# Patient Record
Sex: Male | Born: 1945 | Race: White | Hispanic: No | Marital: Married | Smoking: Current every day smoker
Health system: Southern US, Community
[De-identification: ages and names within clinical notes are randomized; demographics above are authoritative.]

## PROBLEM LIST (undated history)

## (undated) DIAGNOSIS — G9340 Encephalopathy, unspecified: Secondary | ICD-10-CM

## (undated) DIAGNOSIS — I714 Abdominal aortic aneurysm, without rupture, unspecified: Secondary | ICD-10-CM

## (undated) DIAGNOSIS — I10 Essential (primary) hypertension: Secondary | ICD-10-CM

## (undated) DIAGNOSIS — M542 Cervicalgia: Secondary | ICD-10-CM

## (undated) DIAGNOSIS — N189 Chronic kidney disease, unspecified: Secondary | ICD-10-CM

## (undated) DIAGNOSIS — Z7901 Long term (current) use of anticoagulants: Secondary | ICD-10-CM

## (undated) DIAGNOSIS — I480 Paroxysmal atrial fibrillation: Secondary | ICD-10-CM

## (undated) DIAGNOSIS — E538 Deficiency of other specified B group vitamins: Secondary | ICD-10-CM

## (undated) DIAGNOSIS — I214 Non-ST elevation (NSTEMI) myocardial infarction: Secondary | ICD-10-CM

## (undated) DIAGNOSIS — R739 Hyperglycemia, unspecified: Secondary | ICD-10-CM

## (undated) DIAGNOSIS — Z720 Tobacco use: Secondary | ICD-10-CM

## (undated) DIAGNOSIS — R778 Other specified abnormalities of plasma proteins: Secondary | ICD-10-CM

## (undated) DIAGNOSIS — S0990XA Unspecified injury of head, initial encounter: Secondary | ICD-10-CM

## (undated) DIAGNOSIS — I219 Acute myocardial infarction, unspecified: Secondary | ICD-10-CM

## (undated) DIAGNOSIS — M1990 Unspecified osteoarthritis, unspecified site: Secondary | ICD-10-CM

## (undated) DIAGNOSIS — A419 Sepsis, unspecified organism: Secondary | ICD-10-CM

## (undated) DIAGNOSIS — I2510 Atherosclerotic heart disease of native coronary artery without angina pectoris: Secondary | ICD-10-CM

## (undated) DIAGNOSIS — I509 Heart failure, unspecified: Secondary | ICD-10-CM

## (undated) DIAGNOSIS — J189 Pneumonia, unspecified organism: Secondary | ICD-10-CM

## (undated) DIAGNOSIS — G4700 Insomnia, unspecified: Secondary | ICD-10-CM

## (undated) DIAGNOSIS — E7800 Pure hypercholesterolemia, unspecified: Secondary | ICD-10-CM

## (undated) DIAGNOSIS — I161 Hypertensive emergency: Secondary | ICD-10-CM

## (undated) DIAGNOSIS — R7989 Other specified abnormal findings of blood chemistry: Secondary | ICD-10-CM

## (undated) DIAGNOSIS — M79604 Pain in right leg: Secondary | ICD-10-CM

## (undated) DIAGNOSIS — M549 Dorsalgia, unspecified: Secondary | ICD-10-CM

## (undated) HISTORY — DX: Long term (current) use of anticoagulants: Z79.01

## (undated) HISTORY — DX: Hypertensive emergency: I16.1

## (undated) HISTORY — PX: HERNIA REPAIR: SHX51

## (undated) HISTORY — DX: Cervicalgia: M54.2

## (undated) HISTORY — DX: Pain in right leg: M79.604

## (undated) HISTORY — DX: Abdominal aortic aneurysm, without rupture, unspecified: I71.40

## (undated) HISTORY — PX: OTHER SURGICAL HISTORY: SHX169

## (undated) HISTORY — DX: Pure hypercholesterolemia, unspecified: E78.00

## (undated) HISTORY — DX: Encephalopathy, unspecified: G93.40

## (undated) HISTORY — DX: Abdominal aortic aneurysm, without rupture: I71.4

## (undated) HISTORY — DX: Unspecified osteoarthritis, unspecified site: M19.90

## (undated) HISTORY — DX: Dorsalgia, unspecified: M54.9

## (undated) HISTORY — DX: Hyperglycemia, unspecified: R73.9

## (undated) HISTORY — DX: Chronic kidney disease, unspecified: N18.9

## (undated) HISTORY — DX: Other specified abnormalities of plasma proteins: R77.8

## (undated) HISTORY — DX: Other specified abnormal findings of blood chemistry: R79.89

## (undated) HISTORY — DX: Sepsis, unspecified organism: A41.9

## (undated) HISTORY — DX: Pneumonia, unspecified organism: J18.9

## (undated) HISTORY — DX: Non-ST elevation (NSTEMI) myocardial infarction: I21.4

## (undated) HISTORY — DX: Paroxysmal atrial fibrillation: I48.0

## (undated) HISTORY — DX: Insomnia, unspecified: G47.00

## (undated) HISTORY — DX: Deficiency of other specified B group vitamins: E53.8

---

## 2014-09-24 DEATH — deceased

## 2015-03-03 DIAGNOSIS — K4020 Bilateral inguinal hernia, without obstruction or gangrene, not specified as recurrent: Secondary | ICD-10-CM | POA: Diagnosis not present

## 2015-03-04 DIAGNOSIS — Z01818 Encounter for other preprocedural examination: Secondary | ICD-10-CM | POA: Diagnosis not present

## 2015-03-04 DIAGNOSIS — K4090 Unilateral inguinal hernia, without obstruction or gangrene, not specified as recurrent: Secondary | ICD-10-CM | POA: Diagnosis not present

## 2015-03-04 DIAGNOSIS — R9431 Abnormal electrocardiogram [ECG] [EKG]: Secondary | ICD-10-CM | POA: Diagnosis not present

## 2015-03-09 DIAGNOSIS — I69351 Hemiplegia and hemiparesis following cerebral infarction affecting right dominant side: Secondary | ICD-10-CM | POA: Diagnosis not present

## 2015-03-09 DIAGNOSIS — J449 Chronic obstructive pulmonary disease, unspecified: Secondary | ICD-10-CM | POA: Diagnosis not present

## 2015-03-09 DIAGNOSIS — F17210 Nicotine dependence, cigarettes, uncomplicated: Secondary | ICD-10-CM | POA: Diagnosis not present

## 2015-03-09 DIAGNOSIS — K4090 Unilateral inguinal hernia, without obstruction or gangrene, not specified as recurrent: Secondary | ICD-10-CM | POA: Diagnosis not present

## 2015-03-09 DIAGNOSIS — I10 Essential (primary) hypertension: Secondary | ICD-10-CM | POA: Diagnosis not present

## 2015-03-09 DIAGNOSIS — K4020 Bilateral inguinal hernia, without obstruction or gangrene, not specified as recurrent: Secondary | ICD-10-CM | POA: Diagnosis not present

## 2015-05-13 DIAGNOSIS — I10 Essential (primary) hypertension: Secondary | ICD-10-CM | POA: Diagnosis not present

## 2015-05-13 DIAGNOSIS — J449 Chronic obstructive pulmonary disease, unspecified: Secondary | ICD-10-CM | POA: Diagnosis not present

## 2015-05-13 DIAGNOSIS — I6350 Cerebral infarction due to unspecified occlusion or stenosis of unspecified cerebral artery: Secondary | ICD-10-CM | POA: Diagnosis not present

## 2015-05-13 DIAGNOSIS — J019 Acute sinusitis, unspecified: Secondary | ICD-10-CM | POA: Diagnosis not present

## 2015-05-13 DIAGNOSIS — K4090 Unilateral inguinal hernia, without obstruction or gangrene, not specified as recurrent: Secondary | ICD-10-CM | POA: Diagnosis not present

## 2015-05-17 DIAGNOSIS — F17200 Nicotine dependence, unspecified, uncomplicated: Secondary | ICD-10-CM | POA: Diagnosis not present

## 2015-05-17 DIAGNOSIS — K4090 Unilateral inguinal hernia, without obstruction or gangrene, not specified as recurrent: Secondary | ICD-10-CM | POA: Diagnosis not present

## 2015-05-20 DIAGNOSIS — I10 Essential (primary) hypertension: Secondary | ICD-10-CM | POA: Diagnosis not present

## 2015-05-20 DIAGNOSIS — J449 Chronic obstructive pulmonary disease, unspecified: Secondary | ICD-10-CM | POA: Diagnosis not present

## 2015-05-20 DIAGNOSIS — F419 Anxiety disorder, unspecified: Secondary | ICD-10-CM | POA: Diagnosis not present

## 2015-05-20 DIAGNOSIS — K4090 Unilateral inguinal hernia, without obstruction or gangrene, not specified as recurrent: Secondary | ICD-10-CM | POA: Diagnosis not present

## 2015-05-20 DIAGNOSIS — Z8673 Personal history of transient ischemic attack (TIA), and cerebral infarction without residual deficits: Secondary | ICD-10-CM | POA: Diagnosis not present

## 2015-05-20 DIAGNOSIS — Z79899 Other long term (current) drug therapy: Secondary | ICD-10-CM | POA: Diagnosis not present

## 2015-05-21 DIAGNOSIS — I10 Essential (primary) hypertension: Secondary | ICD-10-CM | POA: Diagnosis not present

## 2015-05-21 DIAGNOSIS — K4090 Unilateral inguinal hernia, without obstruction or gangrene, not specified as recurrent: Secondary | ICD-10-CM | POA: Diagnosis not present

## 2015-05-21 DIAGNOSIS — Z79899 Other long term (current) drug therapy: Secondary | ICD-10-CM | POA: Diagnosis not present

## 2015-05-21 DIAGNOSIS — F419 Anxiety disorder, unspecified: Secondary | ICD-10-CM | POA: Diagnosis not present

## 2015-05-21 DIAGNOSIS — J449 Chronic obstructive pulmonary disease, unspecified: Secondary | ICD-10-CM | POA: Diagnosis not present

## 2015-05-21 DIAGNOSIS — Z8673 Personal history of transient ischemic attack (TIA), and cerebral infarction without residual deficits: Secondary | ICD-10-CM | POA: Diagnosis not present

## 2015-06-01 DIAGNOSIS — N400 Enlarged prostate without lower urinary tract symptoms: Secondary | ICD-10-CM | POA: Diagnosis not present

## 2015-06-01 DIAGNOSIS — J449 Chronic obstructive pulmonary disease, unspecified: Secondary | ICD-10-CM | POA: Diagnosis not present

## 2015-06-01 DIAGNOSIS — J209 Acute bronchitis, unspecified: Secondary | ICD-10-CM | POA: Diagnosis not present

## 2015-06-01 DIAGNOSIS — R300 Dysuria: Secondary | ICD-10-CM | POA: Diagnosis not present

## 2015-06-01 DIAGNOSIS — I10 Essential (primary) hypertension: Secondary | ICD-10-CM | POA: Diagnosis not present

## 2015-06-15 DIAGNOSIS — I10 Essential (primary) hypertension: Secondary | ICD-10-CM | POA: Diagnosis not present

## 2015-06-15 DIAGNOSIS — N400 Enlarged prostate without lower urinary tract symptoms: Secondary | ICD-10-CM | POA: Diagnosis not present

## 2015-06-15 DIAGNOSIS — J309 Allergic rhinitis, unspecified: Secondary | ICD-10-CM | POA: Diagnosis not present

## 2015-06-15 DIAGNOSIS — J449 Chronic obstructive pulmonary disease, unspecified: Secondary | ICD-10-CM | POA: Diagnosis not present

## 2015-07-15 DIAGNOSIS — I10 Essential (primary) hypertension: Secondary | ICD-10-CM | POA: Diagnosis not present

## 2015-07-15 DIAGNOSIS — J309 Allergic rhinitis, unspecified: Secondary | ICD-10-CM | POA: Diagnosis not present

## 2015-07-15 DIAGNOSIS — J449 Chronic obstructive pulmonary disease, unspecified: Secondary | ICD-10-CM | POA: Diagnosis not present

## 2015-07-27 DIAGNOSIS — R0981 Nasal congestion: Secondary | ICD-10-CM | POA: Diagnosis not present

## 2015-07-27 DIAGNOSIS — J309 Allergic rhinitis, unspecified: Secondary | ICD-10-CM | POA: Diagnosis not present

## 2015-07-27 DIAGNOSIS — J342 Deviated nasal septum: Secondary | ICD-10-CM | POA: Diagnosis not present

## 2015-07-29 DIAGNOSIS — J309 Allergic rhinitis, unspecified: Secondary | ICD-10-CM | POA: Diagnosis not present

## 2015-07-29 DIAGNOSIS — J449 Chronic obstructive pulmonary disease, unspecified: Secondary | ICD-10-CM | POA: Diagnosis not present

## 2015-07-29 DIAGNOSIS — I10 Essential (primary) hypertension: Secondary | ICD-10-CM | POA: Diagnosis not present

## 2015-09-02 DIAGNOSIS — I69951 Hemiplegia and hemiparesis following unspecified cerebrovascular disease affecting right dominant side: Secondary | ICD-10-CM | POA: Diagnosis not present

## 2015-10-07 DIAGNOSIS — Z23 Encounter for immunization: Secondary | ICD-10-CM | POA: Diagnosis not present

## 2015-10-07 DIAGNOSIS — I69951 Hemiplegia and hemiparesis following unspecified cerebrovascular disease affecting right dominant side: Secondary | ICD-10-CM | POA: Diagnosis not present

## 2015-10-07 DIAGNOSIS — G4700 Insomnia, unspecified: Secondary | ICD-10-CM | POA: Diagnosis not present

## 2015-11-11 DIAGNOSIS — I69951 Hemiplegia and hemiparesis following unspecified cerebrovascular disease affecting right dominant side: Secondary | ICD-10-CM | POA: Diagnosis not present

## 2015-11-11 DIAGNOSIS — I10 Essential (primary) hypertension: Secondary | ICD-10-CM | POA: Diagnosis not present

## 2015-11-11 DIAGNOSIS — J019 Acute sinusitis, unspecified: Secondary | ICD-10-CM | POA: Diagnosis not present

## 2015-11-11 DIAGNOSIS — J449 Chronic obstructive pulmonary disease, unspecified: Secondary | ICD-10-CM | POA: Diagnosis not present

## 2015-11-11 DIAGNOSIS — Z6824 Body mass index (BMI) 24.0-24.9, adult: Secondary | ICD-10-CM | POA: Diagnosis not present

## 2015-12-06 DIAGNOSIS — J449 Chronic obstructive pulmonary disease, unspecified: Secondary | ICD-10-CM | POA: Diagnosis not present

## 2015-12-06 DIAGNOSIS — G4700 Insomnia, unspecified: Secondary | ICD-10-CM | POA: Diagnosis not present

## 2015-12-06 DIAGNOSIS — J309 Allergic rhinitis, unspecified: Secondary | ICD-10-CM | POA: Diagnosis not present

## 2016-05-05 ENCOUNTER — Inpatient Hospital Stay (HOSPITAL_COMMUNITY): Payer: Medicare Other

## 2016-05-05 ENCOUNTER — Encounter (HOSPITAL_COMMUNITY): Payer: Self-pay

## 2016-05-05 ENCOUNTER — Emergency Department (HOSPITAL_COMMUNITY): Payer: Medicare Other

## 2016-05-05 ENCOUNTER — Inpatient Hospital Stay (HOSPITAL_COMMUNITY)
Admission: EM | Admit: 2016-05-05 | Discharge: 2016-05-12 | DRG: 208 | Disposition: A | Discharge status: Home / Self Care | Payer: Medicare Other | Attending: Internal Medicine | Admitting: Internal Medicine

## 2016-05-05 DIAGNOSIS — E1165 Type 2 diabetes mellitus with hyperglycemia: Secondary | ICD-10-CM | POA: Diagnosis present

## 2016-05-05 DIAGNOSIS — I160 Hypertensive urgency: Secondary | ICD-10-CM | POA: Diagnosis present

## 2016-05-05 DIAGNOSIS — R0602 Shortness of breath: Secondary | ICD-10-CM | POA: Diagnosis not present

## 2016-05-05 DIAGNOSIS — Z4659 Encounter for fitting and adjustment of other gastrointestinal appliance and device: Secondary | ICD-10-CM

## 2016-05-05 DIAGNOSIS — J810 Acute pulmonary edema: Secondary | ICD-10-CM

## 2016-05-05 DIAGNOSIS — J189 Pneumonia, unspecified organism: Secondary | ICD-10-CM | POA: Diagnosis not present

## 2016-05-05 DIAGNOSIS — I110 Hypertensive heart disease with heart failure: Secondary | ICD-10-CM | POA: Diagnosis present

## 2016-05-05 DIAGNOSIS — I469 Cardiac arrest, cause unspecified: Secondary | ICD-10-CM | POA: Diagnosis not present

## 2016-05-05 DIAGNOSIS — G9340 Encephalopathy, unspecified: Secondary | ICD-10-CM | POA: Diagnosis not present

## 2016-05-05 DIAGNOSIS — E874 Mixed disorder of acid-base balance: Secondary | ICD-10-CM | POA: Diagnosis not present

## 2016-05-05 DIAGNOSIS — E785 Hyperlipidemia, unspecified: Secondary | ICD-10-CM | POA: Diagnosis present

## 2016-05-05 DIAGNOSIS — J9602 Acute respiratory failure with hypercapnia: Secondary | ICD-10-CM | POA: Diagnosis present

## 2016-05-05 DIAGNOSIS — E669 Obesity, unspecified: Secondary | ICD-10-CM | POA: Diagnosis present

## 2016-05-05 DIAGNOSIS — Z978 Presence of other specified devices: Secondary | ICD-10-CM

## 2016-05-05 DIAGNOSIS — R570 Cardiogenic shock: Secondary | ICD-10-CM | POA: Diagnosis not present

## 2016-05-05 DIAGNOSIS — N179 Acute kidney failure, unspecified: Secondary | ICD-10-CM | POA: Diagnosis not present

## 2016-05-05 DIAGNOSIS — F17210 Nicotine dependence, cigarettes, uncomplicated: Secondary | ICD-10-CM | POA: Diagnosis present

## 2016-05-05 DIAGNOSIS — I214 Non-ST elevation (NSTEMI) myocardial infarction: Secondary | ICD-10-CM

## 2016-05-05 DIAGNOSIS — J157 Pneumonia due to Mycoplasma pneumoniae: Principal | ICD-10-CM | POA: Diagnosis present

## 2016-05-05 DIAGNOSIS — E872 Acidosis, unspecified: Secondary | ICD-10-CM

## 2016-05-05 DIAGNOSIS — I5021 Acute systolic (congestive) heart failure: Secondary | ICD-10-CM | POA: Diagnosis present

## 2016-05-05 DIAGNOSIS — I2510 Atherosclerotic heart disease of native coronary artery without angina pectoris: Secondary | ICD-10-CM | POA: Diagnosis not present

## 2016-05-05 DIAGNOSIS — R7989 Other specified abnormal findings of blood chemistry: Secondary | ICD-10-CM | POA: Diagnosis not present

## 2016-05-05 DIAGNOSIS — R739 Hyperglycemia, unspecified: Secondary | ICD-10-CM | POA: Diagnosis present

## 2016-05-05 DIAGNOSIS — I509 Heart failure, unspecified: Secondary | ICD-10-CM | POA: Diagnosis not present

## 2016-05-05 DIAGNOSIS — I161 Hypertensive emergency: Secondary | ICD-10-CM

## 2016-05-05 DIAGNOSIS — Z7951 Long term (current) use of inhaled steroids: Secondary | ICD-10-CM

## 2016-05-05 DIAGNOSIS — Z6828 Body mass index (BMI) 28.0-28.9, adult: Secondary | ICD-10-CM | POA: Diagnosis not present

## 2016-05-05 DIAGNOSIS — I25110 Atherosclerotic heart disease of native coronary artery with unstable angina pectoris: Secondary | ICD-10-CM | POA: Diagnosis not present

## 2016-05-05 DIAGNOSIS — I255 Ischemic cardiomyopathy: Secondary | ICD-10-CM | POA: Diagnosis present

## 2016-05-05 DIAGNOSIS — J9691 Respiratory failure, unspecified with hypoxia: Secondary | ICD-10-CM | POA: Diagnosis not present

## 2016-05-05 DIAGNOSIS — R778 Other specified abnormalities of plasma proteins: Secondary | ICD-10-CM | POA: Diagnosis present

## 2016-05-05 DIAGNOSIS — J9690 Respiratory failure, unspecified, unspecified whether with hypoxia or hypercapnia: Secondary | ICD-10-CM

## 2016-05-05 DIAGNOSIS — I472 Ventricular tachycardia: Secondary | ICD-10-CM | POA: Diagnosis not present

## 2016-05-05 DIAGNOSIS — I420 Dilated cardiomyopathy: Secondary | ICD-10-CM | POA: Diagnosis present

## 2016-05-05 DIAGNOSIS — A419 Sepsis, unspecified organism: Secondary | ICD-10-CM | POA: Diagnosis not present

## 2016-05-05 DIAGNOSIS — Z4682 Encounter for fitting and adjustment of non-vascular catheter: Secondary | ICD-10-CM | POA: Diagnosis not present

## 2016-05-05 DIAGNOSIS — Z452 Encounter for adjustment and management of vascular access device: Secondary | ICD-10-CM | POA: Diagnosis not present

## 2016-05-05 DIAGNOSIS — D751 Secondary polycythemia: Secondary | ICD-10-CM | POA: Diagnosis present

## 2016-05-05 DIAGNOSIS — R05 Cough: Secondary | ICD-10-CM | POA: Diagnosis not present

## 2016-05-05 DIAGNOSIS — J9601 Acute respiratory failure with hypoxia: Secondary | ICD-10-CM | POA: Diagnosis not present

## 2016-05-05 DIAGNOSIS — E876 Hypokalemia: Secondary | ICD-10-CM | POA: Diagnosis not present

## 2016-05-05 DIAGNOSIS — Z7982 Long term (current) use of aspirin: Secondary | ICD-10-CM

## 2016-05-05 DIAGNOSIS — J9600 Acute respiratory failure, unspecified whether with hypoxia or hypercapnia: Secondary | ICD-10-CM | POA: Diagnosis not present

## 2016-05-05 DIAGNOSIS — Z720 Tobacco use: Secondary | ICD-10-CM | POA: Diagnosis present

## 2016-05-05 DIAGNOSIS — I249 Acute ischemic heart disease, unspecified: Secondary | ICD-10-CM | POA: Diagnosis not present

## 2016-05-05 DIAGNOSIS — R0603 Acute respiratory distress: Secondary | ICD-10-CM

## 2016-05-05 HISTORY — DX: Tobacco use: Z72.0

## 2016-05-05 HISTORY — DX: Pneumonia, unspecified organism: J18.9

## 2016-05-05 HISTORY — DX: Atherosclerotic heart disease of native coronary artery without angina pectoris: I25.10

## 2016-05-05 HISTORY — DX: Essential (primary) hypertension: I10

## 2016-05-05 HISTORY — DX: Unspecified injury of head, initial encounter: S09.90XA

## 2016-05-05 LAB — COMPREHENSIVE METABOLIC PANEL
ALT: 17 U/L (ref 17–63)
AST: 58 U/L — ABNORMAL HIGH (ref 15–41)
Albumin: 4.4 g/dL (ref 3.5–5.0)
Alkaline Phosphatase: 78 U/L (ref 38–126)
Anion gap: 20 — ABNORMAL HIGH (ref 5–15)
BUN: 16 mg/dL (ref 6–20)
CALCIUM: 9.5 mg/dL (ref 8.9–10.3)
CHLORIDE: 103 mmol/L (ref 101–111)
CO2: 18 mmol/L — ABNORMAL LOW (ref 22–32)
CREATININE: 1.27 mg/dL — AB (ref 0.61–1.24)
GFR calc non Af Amer: 56 mL/min — ABNORMAL LOW (ref >60)
Glucose, Bld: 215 mg/dL — ABNORMAL HIGH (ref 65–99)
Potassium: 3.5 mmol/L (ref 3.5–5.1)
Sodium: 141 mmol/L (ref 135–145)
Total Bilirubin: 1.1 mg/dL (ref 0.3–1.2)
Total Protein: 8 g/dL (ref 6.5–8.1)

## 2016-05-05 LAB — CBC WITH DIFFERENTIAL/PLATELET
BASOS ABS: 0 10*3/uL (ref 0.0–0.1)
Basophils Relative: 0 %
Eosinophils Absolute: 0.4 10*3/uL (ref 0.0–0.7)
Eosinophils Relative: 2 %
HEMATOCRIT: 52.7 % — AB (ref 39.0–52.0)
HEMOGLOBIN: 17.5 g/dL — AB (ref 13.0–17.0)
LYMPHS PCT: 54 %
Lymphs Abs: 11.1 10*3/uL — ABNORMAL HIGH (ref 0.7–4.0)
MCH: 33.5 pg (ref 26.0–34.0)
MCHC: 33.2 g/dL (ref 30.0–36.0)
MCV: 100.8 fL — ABNORMAL HIGH (ref 78.0–100.0)
Monocytes Absolute: 1.9 10*3/uL — ABNORMAL HIGH (ref 0.1–1.0)
Monocytes Relative: 9 %
NEUTROS PCT: 35 %
Neutro Abs: 7.2 10*3/uL (ref 1.7–7.7)
Platelets: 187 10*3/uL (ref 150–400)
RBC: 5.23 MIL/uL (ref 4.22–5.81)
RDW: 13.7 % (ref 11.5–15.5)
WBC: 20.6 10*3/uL — AB (ref 4.0–10.5)

## 2016-05-05 LAB — RAPID URINE DRUG SCREEN, HOSP PERFORMED
Amphetamines: NOT DETECTED
BARBITURATES: NOT DETECTED
Benzodiazepines: NOT DETECTED
COCAINE: NOT DETECTED
Opiates: NOT DETECTED
TETRAHYDROCANNABINOL: NOT DETECTED

## 2016-05-05 LAB — BLOOD GAS, ARTERIAL
ACID-BASE DEFICIT: 7.1 mmol/L — AB (ref 0.0–2.0)
Acid-base deficit: 8.2 mmol/L — ABNORMAL HIGH (ref 0.0–2.0)
BICARBONATE: 21.5 meq/L (ref 20.0–24.0)
BICARBONATE: 20.2 meq/L (ref 20.0–24.0)
Drawn by: 257701
Drawn by: 257701
FIO2: 1
FIO2: 1
LHR: 26 {breaths}/min
MECHVT: 550 mL
O2 SAT: 98.8 %
O2 SAT: 99.2 %
PATIENT TEMPERATURE: 98.6
PATIENT TEMPERATURE: 98.6
PCO2 ART: 62.2 mmHg — AB (ref 35.0–45.0)
PCO2 ART: 49 mmHg — AB (ref 35.0–45.0)
PEEP/CPAP: 5 cmH2O
PEEP: 5 cmH2O
PH ART: 7.239 — AB (ref 7.350–7.450)
PO2 ART: 270 mmHg — AB (ref 80.0–100.0)
PO2 ART: 309 mmHg — AB (ref 80.0–100.0)
RATE: 20 resp/min
TCO2: 19.8 mmol/L (ref 0–100)
TCO2: 18.3 mmol/L (ref 0–100)
VT: 550 mL
pH, Arterial: 7.165 — CL (ref 7.350–7.450)

## 2016-05-05 LAB — URINALYSIS, ROUTINE W REFLEX MICROSCOPIC
BILIRUBIN URINE: NEGATIVE
Glucose, UA: 250 mg/dL — AB
KETONES UR: NEGATIVE mg/dL
Leukocytes, UA: NEGATIVE
NITRITE: NEGATIVE
Protein, ur: >300 mg/dL — AB
Specific Gravity, Urine: 1.028 (ref 1.005–1.030)
pH: 5.5 (ref 5.0–8.0)

## 2016-05-05 LAB — URINE MICROSCOPIC-ADD ON

## 2016-05-05 LAB — MAGNESIUM: Magnesium: 2.1 mg/dL (ref 1.7–2.4)

## 2016-05-05 LAB — PROTIME-INR
INR: 1.06 (ref 0.00–1.49)
PROTHROMBIN TIME: 14 s (ref 11.6–15.2)

## 2016-05-05 LAB — BRAIN NATRIURETIC PEPTIDE: B Natriuretic Peptide: 1255.8 pg/mL — ABNORMAL HIGH (ref 0.0–100.0)

## 2016-05-05 LAB — I-STAT TROPONIN, ED: TROPONIN I, POC: 5.77 ng/mL — AB (ref 0.00–0.08)

## 2016-05-05 LAB — LACTIC ACID, PLASMA: Lactic Acid, Venous: 1.6 mmol/L (ref 0.5–2.0)

## 2016-05-05 LAB — PROCALCITONIN: Procalcitonin: <0.1 ng/mL

## 2016-05-05 LAB — HEPARIN LEVEL (UNFRACTIONATED): HEPARIN UNFRACTIONATED: 0.16 [IU]/mL — AB (ref 0.30–0.70)

## 2016-05-05 LAB — I-STAT CG4 LACTIC ACID, ED: Lactic Acid, Venous: 10.3 mmol/L (ref 0.5–2.0)

## 2016-05-05 LAB — MRSA PCR SCREENING: MRSA by PCR: NEGATIVE

## 2016-05-05 LAB — APTT: aPTT: 28 seconds (ref 24–37)

## 2016-05-05 LAB — PHOSPHORUS: Phosphorus: 6.2 mg/dL — ABNORMAL HIGH (ref 2.5–4.6)

## 2016-05-05 LAB — TROPONIN I: Troponin I: 6.84 ng/mL (ref <0.031)

## 2016-05-05 MED ORDER — ROCURONIUM BROMIDE 50 MG/5ML IV SOLN
90.0000 mg | Freq: Once | INTRAVENOUS | Status: AC
  Administered 2016-05-05: 90 mg via INTRAVENOUS

## 2016-05-05 MED ORDER — NITROGLYCERIN IN D5W 200-5 MCG/ML-% IV SOLN
INTRAVENOUS | Status: AC
  Filled 2016-05-05: qty 250

## 2016-05-05 MED ORDER — VITAL HIGH PROTEIN PO LIQD
1000.0000 mL | ORAL | Status: DC
  Administered 2016-05-05 – 2016-05-06 (×4): 1000 mL
  Filled 2016-05-05 (×3): qty 1000

## 2016-05-05 MED ORDER — NOREPINEPHRINE BITARTRATE 1 MG/ML IV SOLN: 0.0000 ug/min | Freq: Once | INTRAVENOUS | Status: DC

## 2016-05-05 MED ORDER — DEXTROSE 5 % IV SOLN: 1.0000 g | INTRAVENOUS | Status: DC

## 2016-05-05 MED ORDER — PANTOPRAZOLE SODIUM 40 MG PO PACK
40.0000 mg | PACK | Freq: Every day | ORAL | Status: DC
  Administered 2016-05-05 – 2016-05-07 (×3): 40 mg
  Filled 2016-05-05 (×5): qty 20

## 2016-05-05 MED ORDER — ACETAMINOPHEN 160 MG/5ML PO SOLN: 650.0000 mg | Freq: Four times a day (QID) | ORAL | Status: DC | PRN

## 2016-05-05 MED ORDER — DEXTROSE 5 % IV SOLN
500.0000 mg | INTRAVENOUS | Status: DC
  Administered 2016-05-06: 500 mg via INTRAVENOUS
  Filled 2016-05-05: qty 500

## 2016-05-05 MED ORDER — DEXTROSE 5 % IV SOLN
1.0000 g | INTRAVENOUS | Status: DC
  Administered 2016-05-06: 1 g via INTRAVENOUS
  Filled 2016-05-05: qty 10

## 2016-05-05 MED ORDER — ASPIRIN 300 MG RE SUPP
300.0000 mg | Freq: Once | RECTAL | Status: AC
  Administered 2016-05-05: 300 mg via RECTAL
  Filled 2016-05-05: qty 1

## 2016-05-05 MED ORDER — FUROSEMIDE 10 MG/ML IJ SOLN
40.0000 mg | Freq: Once | INTRAMUSCULAR | Status: AC
  Administered 2016-05-05: 40 mg via INTRAVENOUS
  Filled 2016-05-05: qty 4

## 2016-05-05 MED ORDER — INSULIN ASPART 100 UNIT/ML ~~LOC~~ SOLN
0.0000 [IU] | SUBCUTANEOUS | Status: DC
  Administered 2016-05-05: 7 [IU] via SUBCUTANEOUS
  Administered 2016-05-06 (×3): 4 [IU] via SUBCUTANEOUS
  Administered 2016-05-07: 3 [IU] via SUBCUTANEOUS
  Administered 2016-05-07: 4 [IU] via SUBCUTANEOUS
  Administered 2016-05-07 (×2): 3 [IU] via SUBCUTANEOUS
  Administered 2016-05-07: 4 [IU] via SUBCUTANEOUS
  Administered 2016-05-08 (×2): 7 [IU] via SUBCUTANEOUS
  Administered 2016-05-08 (×2): 4 [IU] via SUBCUTANEOUS
  Administered 2016-05-09 (×2): 3 [IU] via SUBCUTANEOUS
  Administered 2016-05-09 (×2): 4 [IU] via SUBCUTANEOUS
  Administered 2016-05-09: 7 [IU] via SUBCUTANEOUS
  Filled 2016-05-05: qty 1

## 2016-05-05 MED ORDER — NITROGLYCERIN IN D5W 200-5 MCG/ML-% IV SOLN
10.0000 ug/min | Freq: Once | INTRAVENOUS | Status: AC
  Administered 2016-05-05: 10 ug/min via INTRAVENOUS

## 2016-05-05 MED ORDER — ANTISEPTIC ORAL RINSE SOLUTION (CORINZ)
7.0000 mL | OROMUCOSAL | Status: DC
  Administered 2016-05-05 – 2016-05-09 (×35): 7 mL via OROMUCOSAL

## 2016-05-05 MED ORDER — IPRATROPIUM-ALBUTEROL 0.5-2.5 (3) MG/3ML IN SOLN: 3.0000 mL | RESPIRATORY_TRACT | Status: DC | PRN

## 2016-05-05 MED ORDER — AZITHROMYCIN 500 MG IV SOLR
500.0000 mg | Freq: Once | INTRAVENOUS | Status: AC
  Administered 2016-05-05: 500 mg via INTRAVENOUS
  Filled 2016-05-05: qty 500

## 2016-05-05 MED ORDER — HEPARIN (PORCINE) IN NACL 100-0.45 UNIT/ML-% IJ SOLN: 12.0000 [IU]/kg/h | INTRAMUSCULAR | Status: DC

## 2016-05-05 MED ORDER — MIDAZOLAM HCL 2 MG/2ML IJ SOLN: 1.0000 mg | INTRAMUSCULAR | Status: DC | PRN

## 2016-05-05 MED ORDER — DEXTROSE 5 % IV SOLN
1.0000 g | Freq: Once | INTRAVENOUS | Status: AC
  Administered 2016-05-05: 1 g via INTRAVENOUS
  Filled 2016-05-05: qty 10

## 2016-05-05 MED ORDER — MIDAZOLAM HCL 2 MG/2ML IJ SOLN
2.0000 mg | INTRAMUSCULAR | Status: DC | PRN
  Administered 2016-05-05: 2 mg via INTRAVENOUS
  Filled 2016-05-05: qty 2

## 2016-05-05 MED ORDER — FENTANYL CITRATE (PF) 100 MCG/2ML IJ SOLN
100.0000 ug | INTRAMUSCULAR | Status: DC | PRN
  Administered 2016-05-05: 100 ug via INTRAVENOUS
  Filled 2016-05-05: qty 2

## 2016-05-05 MED ORDER — ETOMIDATE 2 MG/ML IV SOLN
30.0000 mg | Freq: Once | INTRAVENOUS | Status: AC
  Administered 2016-05-05: 30 mg via INTRAVENOUS

## 2016-05-05 MED ORDER — SODIUM CHLORIDE 0.9 % IV SOLN
25.0000 ug/h | INTRAVENOUS | Status: DC
  Administered 2016-05-05: 50 ug/h via INTRAVENOUS
  Administered 2016-05-05: 75 ug/h via INTRAVENOUS
  Filled 2016-05-05: qty 50

## 2016-05-05 MED ORDER — FENTANYL BOLUS VIA INFUSION
25.0000 ug | INTRAVENOUS | Status: DC | PRN
  Filled 2016-05-05: qty 25

## 2016-05-05 MED ORDER — HEPARIN BOLUS VIA INFUSION
4000.0000 [IU] | Freq: Once | INTRAVENOUS | Status: AC
Start: 2016-05-05 — End: 2016-05-05
  Administered 2016-05-05: 4000 [IU] via INTRAVENOUS
  Filled 2016-05-05: qty 4000

## 2016-05-05 MED ORDER — DEXTROSE 5 % IV SOLN: 500.0000 mg | INTRAVENOUS | Status: DC

## 2016-05-05 MED ORDER — HEPARIN (PORCINE) IN NACL 100-0.45 UNIT/ML-% IJ SOLN
1250.0000 [IU]/h | INTRAMUSCULAR | Status: DC
  Administered 2016-05-05 (×2): 1100 [IU]/h via INTRAVENOUS
  Administered 2016-05-06: 1250 [IU]/h via INTRAVENOUS
  Filled 2016-05-05 (×4): qty 250

## 2016-05-05 MED ORDER — ASPIRIN 81 MG PO CHEW
81.0000 mg | CHEWABLE_TABLET | Freq: Every day | ORAL | Status: DC
  Administered 2016-05-06 – 2016-05-12 (×7): 81 mg via ORAL
  Filled 2016-05-05 (×7): qty 1

## 2016-05-05 MED ORDER — PROPOFOL 1000 MG/100ML IV EMUL
INTRAVENOUS | Status: AC
  Administered 2016-05-05: 1000 mg
  Filled 2016-05-05: qty 100

## 2016-05-05 MED ORDER — FENTANYL CITRATE (PF) 100 MCG/2ML IJ SOLN
50.0000 ug | Freq: Once | INTRAMUSCULAR | Status: AC
  Administered 2016-05-05: 50 ug via INTRAVENOUS
  Filled 2016-05-05: qty 2

## 2016-05-05 MED ORDER — CHLORHEXIDINE GLUCONATE 0.12% ORAL RINSE (MEDLINE KIT)
15.0000 mL | Freq: Two times a day (BID) | OROMUCOSAL | Status: DC
  Administered 2016-05-05 – 2016-05-09 (×9): 15 mL via OROMUCOSAL

## 2016-05-05 NOTE — Progress Notes (Signed)
Patient listed as not having insurance or a pcp.  EDCM went to speak to patient at bedside, however, RN in room performing procedure.  Patient is intubated.

## 2016-05-05 NOTE — H&P (Addendum)
PULMONARY / CRITICAL CARE MEDICINE   Name: Gregory Dyer MRN: [id number] DOB: July 13, 1946    ADMISSION DATE:  05/05/2016  REFERRING MD:  ER  CHIEF COMPLAINT:  Short of breath  HISTORY OF PRESENT ILLNESS:   70 yo male developed productive cough 3 days prior to admission.  Over 24 hours prior to admission he had sudden onset of dyspnea that became progressively worse.  He was also having diaphoresis.  In ER he had SpO2 in 50's.  He required intubation.  He had BP 196/113 on arrival to ER, and temperature of 95.9.  WBC and Lactic acid elevated.  CXR showed diffuse, patchy ASD concerning for pneumonia.  There was concern for NSTEMI >> Dr. Terrence Dupont was consulted by EDP, and recommend to start heparin gtt.  He was started on antibiotics in ER for community acquired pneumonia.  PAST MEDICAL HISTORY :  He  has a past medical history of Hypertension.  PAST SURGICAL HISTORY: He  has past surgical history that includes Hernia repair.  Allergies  Allergen Reactions  . No Known Allergies     No current facility-administered medications on file prior to encounter.   No current outpatient prescriptions on file prior to encounter.    FAMILY HISTORY:  Unable to obtain family history due to altered mental status.   SOCIAL HISTORY: He  reports that he has been smoking Cigarettes.  He has been smoking about 1.00 pack per day. He has never used smokeless tobacco. He reports that he drinks about 8.4 oz of alcohol per week. He reports that he does not use illicit drugs.  REVIEW OF SYSTEMS:   Unable to obtain due to altered mental status  SUBJECTIVE:   VITAL SIGNS: BP 126/83 mmHg  Pulse 83  Temp(Src) 96.1 F (35.6 C) (Core (Comment))  Resp 26  Ht 5\' 8"  (1.727 m)  Wt 90.719 kg (200 lb)  BMI 30.42 kg/m2  SpO2 96%  HEMODYNAMICS:    VENTILATOR SETTINGS: Vent Mode:  [-] PRVC FiO2 (%):  [70 %-100 %] 70 % Set Rate:  [20 bmp-26 bmp] 26 bmp Vt Set:  [550 mL] 550 mL PEEP:  [5 cmH20] 5  cmH20 Plateau Pressure:  [25 cmH20] 25 cmH20  INTAKE / OUTPUT:    PHYSICAL EXAMINATION: General:  Awake, agitated.  Neuro:  Moves all 4 extremities, No focal defects HEENT:  PERRL, No thyromegaly, JVD Cardiovascular:  RRR, No MRG Lungs:  B/L Rhonchi, no wheeze Abdomen:  Obese, soft, distended, + BS Musculoskeletal:  Normal tone and bulk, no edema. Skin:  Intact  LABS:  BMET  Recent Labs Lab 05/05/16 1545  NA 141  K 3.5  CL 103  CO2 18*  BUN 16  CREATININE 1.27*  GLUCOSE 215*    Electrolytes  Recent Labs Lab 05/05/16 1545  CALCIUM 9.5  MG 2.1  PHOS 6.2*    CBC  Recent Labs Lab 05/05/16 1545  WBC 20.6*  HGB 17.5*  HCT 52.7*  PLT 187    Coag's  Recent Labs Lab 05/05/16 1545  APTT 28  INR 1.06    Sepsis Markers  Recent Labs Lab 05/05/16 1612  LATICACIDVEN 10.30*    ABG  Recent Labs Lab 05/05/16 1645  PHART 7.165*  PCO2ART 62.2*  PO2ART 270*    Liver Enzymes  Recent Labs Lab 05/05/16 1545  AST 58*  ALT 17  ALKPHOS 78  BILITOT 1.1  ALBUMIN 4.4    Cardiac Enzymes No results for input(s): TROPONINI, PROBNP in the last 168 hours.  Glucose No results for input(s): GLUCAP in the last 168 hours.  Imaging Dg Chest Portable 1 View  05/05/2016  CLINICAL DATA:  70 year old male with complaints of shortness of breath, and cough for the past 3 days. EXAM: PORTABLE CHEST 1 VIEW COMPARISON:  No priors. FINDINGS: An endotracheal tube is in place with tip 5.7 cm above the carina. Lung volumes appear normal. Diffuse peribronchial cuffing, interstitial prominence in widespread airspace disease throughout the lungs bilaterally. Pulmonary vasculature is obscured. Relative sparing of the lung parenchyma in the periphery of the lungs bilaterally. No definite pleural effusions. Heart size appears upper limits of normal. Upper mediastinal contours are within normal limits. Atherosclerosis in the thoracic aorta. Coronary artery stent incidentally  noted. IMPRESSION: 1. Endotracheal tube appears properly located. 2. Diffuse peribronchial cuffing, interstitial prominence and patchy airspace disease throughout the lungs bilaterally, concerning for severe bronchitis and multilobar bronchopneumonia. Findings are not favored to reflect underlying pulmonary edema, although noncardiogenic edema could be considered. 3. Atherosclerosis. Electronically Signed   By: Vinnie Langton M.D.   On: 05/05/2016 16:17    STUDIES:  CXR 5/12 > B/L opacities. Images reviewed  CULTURES: 5/12 Blood >> 5/12 Sputum >> 5/12 Pneumococcal Ag >> 5/12 Legionella Ag >> 5/12 Respiratory viral panel >>   ANTIBIOTICS: 5/12 Rocephin >> 5/12 Zithromax >>  SIGNIFICANT EVENTS: 5/12 Admit, cardiology consulted, start heparin gtt  LINES/TUBES: 5/12 ETT >>   DISCUSSION: 70 yo male smoker with acute hypoxic/hypercapnic respiratory failure, productive cough, leukocytosis, lactic acidosis, HTN emergency, b/l pulmonary infiltrates concerning for pneumonia, and NSTEMI.  ASSESSMENT / PLAN:  PULMONARY A: Acute hypoxic/hypercapnic respiratory failure 2nd to b/l pulmonary infiltrates concerning for CAP +/- acute pulmonary edema. Tobacco abuse. P:   Full vent support F/u CXR, ABG Prn duoneb  CARDIOVASCULAR A:  HTN emergency. NSTEMI likely from demand ischemia. P:  Heparin gtt per cardiology F/u cardiac enzymes, Echo ASA Cardiology consulted by EDP  RENAL A:   Elevated creatinine >> not sure what baseline renal fx is. Anion gap acidosis with elevated lactic acid. P:   F/u BMET, lactic acid, ABG Monitor renal fx, urine outpt, electrolytes  GASTROINTESTINAL A:   Nutrition. P:   Tube feeds while on vent Protonix for SUP  HEMATOLOGIC A:   Leukocytosis, polycythemia. P:  F/u CBC  INFECTIOUS A:   Sepsis from community acquired pneumonia. P:   Day 1 of rocephin, zithromax F/U mico Check HIV  ENDOCRINE A:   Hyperglycemia >> no reported hx of  DM. P:   SSI  NEUROLOGIC A:   Acute encephalopathy 2nd to respiratory failure, sepsis/pneumonia. Hypotension with propofol P:   RASS goal: -1 Fentanyl gtt + versed PRN for sedation. F/u urine drug screen  FAMILY  - Updates: Fiance updated at bedside 5/12. Pt is estranged from his family. - Inter-disciplinary family meet or Palliative Care meeting due by:  5/19  Critical care time- 35 mins.  Marshell Garfinkel MD [facility] Pager [phone] If no answer or after 3pm call: [phone] 05/05/2016, 6:13 PM
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
## 2016-05-05 NOTE — Progress Notes (Signed)
Chaplain responded to referral by staff to provide care to the Significant Other of Mr Letter, Ms Julious Payer. Ms Ronnald Ramp related that Mr Delhoyo has a heart condition and the situation is grave. She was waiting for her pastor to arrive to be with her. She was told to have the chaplain paged if she needed help in any way.  Sallee Lange. Safal Halderman, [place]

## 2016-05-05 NOTE — Progress Notes (Signed)
CRITICAL VALUE ALERT  Critical value received:  Troponin = 6.84  Date of notification:  05/05/16    Time of notification:  0720 (from ED charge nurse Armanda Heritage)   Critical value read back:Yes.    Nurse who received alert:   Merlene Laughter  MD notified (1st page):  Dr. Halford Chessman  Time of first page:  [phone] (phone notification)  Responding MD:  Dr. Halford Chessman  Time MD responded:  [phone]

## 2016-05-05 NOTE — ED Notes (Signed)
Erin MD notified about BP

## 2016-05-05 NOTE — Progress Notes (Signed)
Pt transported to ICU on VENT with 100% FIO2 without incident.  RT to monitor and assess as needed.

## 2016-05-05 NOTE — Progress Notes (Signed)
ANTICOAGULATION CONSULT NOTE - Initial Consult  Pharmacy Consult for Heparin Indication: chest pain/ACS  Allergies  Allergen Reactions  . No Known Allergies     Patient Measurements: Height: 5\' 8"  (172.7 cm) Weight: 200 lb (90.719 kg) IBW/kg (Calculated) : 68.4 Heparin Dosing Weight: 87 kg  Vital Signs: BP: 150/90 mmHg (05/12 1615) Pulse Rate: 106 (05/12 1615)  Labs:  Recent Labs  05/05/16 1545  HGB 17.5*  HCT 52.7*  PLT 187    CrCl cannot be calculated (Patient has no serum creatinine result on file.).   Medical History: Past Medical History  Diagnosis Date  . Hypertension     Assessment: 3 yoM presents with shortness of breath with productive cough over 3 days.  Denied chest pain.  Pt hypoxic with respiratory failure and intubated.  Also in hypertensive emergency.  Pharmacy consulted to start heparin infusion for possible ACS.  No history of anticoagulants on file.  Baseline anticoag labs ordered STAT. CBC shows high Hgb, plts WNL SCr pending  Goal of Therapy:  Heparin level 0.3-0.7 units/ml Monitor platelets by anticoagulation protocol: Yes   Plan:  Heparin 4000 unit bolus x 1 then start infusion at 1100 units/hr. F/u baseline labs.  Check heparin level in 6 hours. Daily CBC and heparin level while on heparin infusion.  Hershal Coria 05/05/2016,4:39 PM

## 2016-05-05 NOTE — ED Notes (Signed)
Patient brought back to res A sats in 96s, intubated. Tube placed with glide, verified. Present: [place] Bing, MD, Jimmy Picket, Billey Chang RN, Jo Cerone RN, Soundra Pilon, William NT.

## 2016-05-05 NOTE — ED Notes (Signed)
Patient brought in pov with complaints of SOB, cough x3 days (yellow sputum). 17min before arrival sudden onset of SOB, lethargy. O2Sats 50s upon arrival.

## 2016-05-05 NOTE — Consult Note (Signed)
Reason for Consult: Elevated troponin I Referring Physician: CCM  Gregory Dyer is an 70 y.o. male.  HPI: Patient is 70 year old male with past medical history significant for hypertension, tobacco abuse ran out of blood pressure medicine approximately 5 months ago came to the ER by his fiance as he developed progressive worsening shortness of breath while shopping for pickup truck. As per fiance patient has been having progressive coughing associated with shortness of breath for last 1-1/2 week last night his breathing or worst and decided to come to the ED but felt better and went back home. Patient in the ED was noted to be hypoxic tachycardic and suddenly became unresponsive requiring intubation in the ED. Patient denied any chest pain as per his fiance. She states he was doing well approximately 10 days ago ran out of blood pressure medicine approximately 5 months ago and could not afford to get it. No history of PND orthopnea or leg swelling in the past. EKG done in the ER showed sinus tachycardia with poor R-wave progression in anterior leads and T wave inversion in lateral leads. First set of troponin I was elevated to 5.77. Patient presently intubated sedated. No further history could be opted.  Past Medical History  Diagnosis Date  . Hypertension     Past Surgical History  Procedure Laterality Date  . Hernia repair      History reviewed. No pertinent family history.  Social History:  reports that he has been smoking Cigarettes.  He has been smoking about 1.00 pack per day. He has never used smokeless tobacco. He reports that he drinks about 8.4 oz of alcohol per week. He reports that he does not use illicit drugs.  Allergies:  Allergies  Allergen Reactions  . No Known Allergies     Medications: I have reviewed the patient's current medications.  Results for orders placed or performed during the hospital encounter of 05/05/16 (from the past 48 hour(s))  Comprehensive  metabolic panel     Status: Abnormal   Collection Time: 05/05/16  3:45 PM  Result Value Ref Range   Sodium 141 135 - 145 mmol/L   Potassium 3.5 3.5 - 5.1 mmol/L   Chloride 103 101 - 111 mmol/L   CO2 18 (L) 22 - 32 mmol/L   Glucose, Bld 215 (H) 65 - 99 mg/dL   BUN 16 6 - 20 mg/dL   Creatinine, Ser 1.27 (H) 0.61 - 1.24 mg/dL   Calcium 9.5 8.9 - 10.3 mg/dL   Total Protein 8.0 6.5 - 8.1 g/dL   Albumin 4.4 3.5 - 5.0 g/dL   AST 58 (H) 15 - 41 U/L   ALT 17 17 - 63 U/L   Alkaline Phosphatase 78 38 - 126 U/L   Total Bilirubin 1.1 0.3 - 1.2 mg/dL   GFR calc non Af Amer 56 (L) >60 mL/min   GFR calc Af Amer >60 >60 mL/min    Comment: (NOTE) The eGFR has been calculated using the CKD EPI equation. This calculation has not been validated in all clinical situations. eGFR's persistently <60 mL/min signify possible Chronic Kidney Disease.    Anion gap 20 (H) 5 - 15  Brain natriuretic peptide     Status: Abnormal   Collection Time: 05/05/16  3:45 PM  Result Value Ref Range   B Natriuretic Peptide 1255.8 (H) 0.0 - 100.0 pg/mL  Magnesium     Status: None   Collection Time: 05/05/16  3:45 PM  Result Value Ref Range   Magnesium  2.1 1.7 - 2.4 mg/dL  Phosphorus     Status: Abnormal   Collection Time: 05/05/16  3:45 PM  Result Value Ref Range   Phosphorus 6.2 (H) 2.5 - 4.6 mg/dL  CBC with Differential     Status: Abnormal   Collection Time: 05/05/16  3:45 PM  Result Value Ref Range   WBC 20.6 (H) 4.0 - 10.5 K/uL   RBC 5.23 4.22 - 5.81 MIL/uL   Hemoglobin 17.5 (H) 13.0 - 17.0 g/dL   HCT 52.7 (H) 39.0 - 52.0 %   MCV 100.8 (H) 78.0 - 100.0 fL   MCH 33.5 26.0 - 34.0 pg   MCHC 33.2 30.0 - 36.0 g/dL   RDW 13.7 11.5 - 15.5 %   Platelets 187 150 - 400 K/uL   Neutrophils Relative % 35 %   Lymphocytes Relative 54 %   Monocytes Relative 9 %   Eosinophils Relative 2 %   Basophils Relative 0 %   Neutro Abs 7.2 1.7 - 7.7 K/uL   Lymphs Abs 11.1 (H) 0.7 - 4.0 K/uL   Monocytes Absolute 1.9 (H) 0.1 -  1.0 K/uL   Eosinophils Absolute 0.4 0.0 - 0.7 K/uL   Basophils Absolute 0.0 0.0 - 0.1 K/uL   WBC Morphology ABSOLUTE LYMPHOCYTOSIS   APTT     Status: None   Collection Time: 05/05/16  3:45 PM  Result Value Ref Range   aPTT 28 24 - 37 seconds  Protime-INR     Status: None   Collection Time: 05/05/16  3:45 PM  Result Value Ref Range   Prothrombin Time 14.0 11.6 - 15.2 seconds   INR 1.06 0.00 - 1.49  I-stat troponin, ED (not at [facility])     Status: Abnormal   Collection Time: 05/05/16  4:09 PM  Result Value Ref Range   Troponin i, poc 5.77 (HH) 0.00 - 0.08 ng/mL   Comment NOTIFIED PHYSICIAN    Comment 3            Comment: Due to the release kinetics of cTnI, a negative result within the first hours of the onset of symptoms does not rule out myocardial infarction with certainty. If myocardial infarction is still suspected, repeat the test at appropriate intervals.   I-Stat CG4 Lactic Acid, ED  (not at  [facility])     Status: Abnormal   Collection Time: 05/05/16  4:12 PM  Result Value Ref Range   Lactic Acid, Venous 10.30 (HH) 0.5 - 2.0 mmol/L   Comment NOTIFIED PHYSICIAN   Urinalysis, Routine w reflex microscopic (not at [facility])     Status: Abnormal   Collection Time: 05/05/16  4:41 PM  Result Value Ref Range   Color, Urine YELLOW YELLOW   APPearance CLOUDY (A) CLEAR   Specific Gravity, Urine 1.028 1.005 - 1.030   pH 5.5 5.0 - 8.0   Glucose, UA 250 (A) NEGATIVE mg/dL   Hgb urine dipstick MODERATE (A) NEGATIVE   Bilirubin Urine NEGATIVE NEGATIVE   Ketones, ur NEGATIVE NEGATIVE mg/dL   Protein, ur >300 (A) NEGATIVE mg/dL   Nitrite NEGATIVE NEGATIVE   Leukocytes, UA NEGATIVE NEGATIVE  Urine microscopic-add on     Status: Abnormal   Collection Time: 05/05/16  4:41 PM  Result Value Ref Range   Squamous Epithelial / LPF 0-5 (A) NONE SEEN   WBC, UA 6-30 0 - 5 WBC/hpf   RBC / HPF 6-30 0 - 5 RBC/hpf   Bacteria, UA FEW (A) NONE SEEN   Casts  HYALINE CASTS (A) NEGATIVE   Sperm, UA  PRESENT    Urine-Other MUCOUS PRESENT     Comment: AMORPHOUS URATES/PHOSPHATES  Blood gas, arterial     Status: Abnormal   Collection Time: 05/05/16  4:45 PM  Result Value Ref Range   FIO2 1.00    Delivery systems VENTILATOR    Mode PRESSURE REGULATED VOLUME CONTROL    VT 550 mL   LHR 20 resp/min   Peep/cpap 5.0 cm H20   pH, Arterial 7.165 (LL) 7.350 - 7.450    Comment: RBV DR. Billy Fischer AT 3267 BY DEE WALTERS RRT , RCP ON 05/05/16    pCO2 arterial 62.2 (HH) 35.0 - 45.0 mmHg    Comment: CRITICAL RESULT CALLED TO, READ BACK BY AND VERIFIED WITH: DR. Billy Fischer AT 1245 BY DEE WALTERS ON 05/05/16    pO2, Arterial 270 (H) 80.0 - 100.0 mmHg   Bicarbonate 21.5 20.0 - 24.0 mEq/L   TCO2 19.8 0 - 100 mmol/L   Acid-base deficit 8.2 (H) 0.0 - 2.0 mmol/L   O2 Saturation 98.8 %   Patient temperature 98.6    Collection site RIGHT RADIAL    Drawn by [id number]    Sample type ARTERIAL DRAW     Dg Chest Portable 1 View  05/05/2016  CLINICAL DATA:  70 year old male with complaints of shortness of breath, and cough for the past 3 days. EXAM: PORTABLE CHEST 1 VIEW COMPARISON:  No priors. FINDINGS: An endotracheal tube is in place with tip 5.7 cm above the carina. Lung volumes appear normal. Diffuse peribronchial cuffing, interstitial prominence in widespread airspace disease throughout the lungs bilaterally. Pulmonary vasculature is obscured. Relative sparing of the lung parenchyma in the periphery of the lungs bilaterally. No definite pleural effusions. Heart size appears upper limits of normal. Upper mediastinal contours are within normal limits. Atherosclerosis in the thoracic aorta. Coronary artery stent incidentally noted. IMPRESSION: 1. Endotracheal tube appears properly located. 2. Diffuse peribronchial cuffing, interstitial prominence and patchy airspace disease throughout the lungs bilaterally, concerning for severe bronchitis and multilobar bronchopneumonia. Findings are not favored to reflect  underlying pulmonary edema, although noncardiogenic edema could be considered. 3. Atherosclerosis. Electronically Signed   By: Vinnie Langton M.D.   On: 05/05/2016 16:17    Review of Systems  Unable to perform ROS: intubated   Blood pressure 126/83, pulse 83, temperature 96.1 F (35.6 C), temperature source Core (Comment), resp. rate 26, height 5' 8" (1.727 m), weight 90.719 kg (200 lb), SpO2 96 %. Physical Exam  Eyes: Conjunctivae are normal. Pupils are equal, round, and reactive to light. Left eye exhibits no discharge.  Neck: Normal range of motion. Neck supple. JVD present. No tracheal deviation present. No thyromegaly present.  Cardiovascular: Normal rate and regular rhythm.   Murmur (Soft systolic murmur noted) heard. Respiratory:  Bilateral rhonchi and rales noted  GI: Soft. Bowel sounds are normal. He exhibits no distension. There is no tenderness. There is no rebound.  Musculoskeletal:  No clubbing cyanosis trace edema noted  Neurological:  Intubated sedated    Assessment/Plan: Acute coronary syndrome Acute hypoxic respiratory failure Bilateral bronchopneumonia Status post hypertensive urgency Acute congestive heart failure Tobacco abuse Plan Check serial enzymes and EKG Check 2-D echo to check LV function and wall motion abnormalities Agree with IV heparin nitroglycerin, Lasix aspirin and statin We will start beta blockers once blood pressure tolerates and fully compensated Will need cardiac catheterization once stable and extubated. Discussed with his fianc and agrees.  [name] 05/05/2016, 5:30  PM      

## 2016-05-05 NOTE — ED Notes (Signed)
Notified receiving RN, Abla that the pt has critical troponin level of 6.84

## 2016-05-05 NOTE — ED Notes (Signed)
Dr Vaughan Browner at bedside for central line placement

## 2016-05-05 NOTE — ED Notes (Signed)
Blood glucose 241. Edit sheet completed because MRN number wasn't available  at this time.

## 2016-05-05 NOTE — Progress Notes (Signed)
RT assisted ED MD with intubation- uneventful (details and vent setting on Flowsheet). Intubation resulted in positive end tidal, bilateral breath sounds, Sp02 100%. ABG will be obtain post intubation.

## 2016-05-05 NOTE — Progress Notes (Signed)
Utilization Review completed.  Livia Snellen RN CM  Completed in [place]

## 2016-05-06 ENCOUNTER — Inpatient Hospital Stay (HOSPITAL_COMMUNITY): Payer: Medicare Other

## 2016-05-06 DIAGNOSIS — J9601 Acute respiratory failure with hypoxia: Secondary | ICD-10-CM

## 2016-05-06 HISTORY — DX: Acute respiratory failure with hypoxia: J96.01

## 2016-05-06 LAB — COMPREHENSIVE METABOLIC PANEL
ALK PHOS: 52 U/L (ref 38–126)
ALT: 17 U/L (ref 17–63)
ANION GAP: 9 (ref 5–15)
AST: 34 U/L (ref 15–41)
Albumin: 2.9 g/dL — ABNORMAL LOW (ref 3.5–5.0)
BILIRUBIN TOTAL: 0.8 mg/dL (ref 0.3–1.2)
BUN: 17 mg/dL (ref 6–20)
CALCIUM: 7.6 mg/dL — AB (ref 8.9–10.3)
CO2: 23 mmol/L (ref 22–32)
Chloride: 109 mmol/L (ref 101–111)
Creatinine, Ser: 0.99 mg/dL (ref 0.61–1.24)
GFR calc Af Amer: >60 mL/min (ref >60)
GLUCOSE: 117 mg/dL — AB (ref 65–99)
POTASSIUM: 3.7 mmol/L (ref 3.5–5.1)
Sodium: 141 mmol/L (ref 135–145)
TOTAL PROTEIN: 5.3 g/dL — AB (ref 6.5–8.1)

## 2016-05-06 LAB — RESPIRATORY PANEL BY PCR

## 2016-05-06 LAB — BLOOD GAS, ARTERIAL
ACID-BASE DEFICIT: 3 mmol/L — AB (ref 0.0–2.0)
BICARBONATE: 20.6 meq/L (ref 20.0–24.0)
Drawn by: 11249
FIO2: 0.4
LHR: 26 {breaths}/min
O2 SAT: 97.9 %
PATIENT TEMPERATURE: 37.6
PCO2 ART: 35.1 mmHg (ref 35.0–45.0)
PEEP/CPAP: 5 cmH2O
PH ART: 7.389 (ref 7.350–7.450)
TCO2: 18 mmol/L (ref 0–100)
VT: 550 mL
pO2, Arterial: 108 mmHg — ABNORMAL HIGH (ref 80.0–100.0)

## 2016-05-06 LAB — LIPID PANEL
CHOLESTEROL: 150 mg/dL (ref 0–200)
HDL: 26 mg/dL — ABNORMAL LOW (ref >40)
LDL Cholesterol: 104 mg/dL — ABNORMAL HIGH (ref 0–99)
TRIGLYCERIDES: 102 mg/dL (ref <150)
Total CHOL/HDL Ratio: 5.8 RATIO
VLDL: 20 mg/dL (ref 0–40)

## 2016-05-06 LAB — CBC
HCT: 39 % (ref 39.0–52.0)
HCT: 41.4 % (ref 39.0–52.0)
Hemoglobin: 13.2 g/dL (ref 13.0–17.0)
Hemoglobin: 14 g/dL (ref 13.0–17.0)
MCH: 32.6 pg (ref 26.0–34.0)
MCH: 32.5 pg (ref 26.0–34.0)
MCHC: 33.8 g/dL (ref 30.0–36.0)
MCHC: 33.8 g/dL (ref 30.0–36.0)
MCV: 96.3 fL (ref 78.0–100.0)
MCV: 96.1 fL (ref 78.0–100.0)
PLATELETS: 133 10*3/uL — AB (ref 150–400)
PLATELETS: 150 10*3/uL (ref 150–400)
RBC: 4.05 MIL/uL — AB (ref 4.22–5.81)
RBC: 4.31 MIL/uL (ref 4.22–5.81)
RDW: 13.6 % (ref 11.5–15.5)
RDW: 13.4 % (ref 11.5–15.5)
WBC: 8.5 10*3/uL (ref 4.0–10.5)
WBC: 11.1 10*3/uL — AB (ref 4.0–10.5)

## 2016-05-06 LAB — STREP PNEUMONIAE URINARY ANTIGEN: Strep Pneumo Urinary Antigen: NEGATIVE

## 2016-05-06 LAB — TROPONIN I
TROPONIN I: 5.03 ng/mL — AB (ref <0.031)
TROPONIN I: 5.93 ng/mL — AB (ref <0.031)
TROPONIN I: 6.88 ng/mL — AB (ref <0.031)
Troponin I: 7.25 ng/mL (ref <0.031)
Troponin I: 6.93 ng/mL (ref <0.031)

## 2016-05-06 LAB — GLUCOSE, CAPILLARY
GLUCOSE-CAPILLARY: 115 mg/dL — AB (ref 65–99)
GLUCOSE-CAPILLARY: 101 mg/dL — AB (ref 65–99)
GLUCOSE-CAPILLARY: 107 mg/dL — AB (ref 65–99)
Glucose-Capillary: 115 mg/dL — ABNORMAL HIGH (ref 65–99)
Glucose-Capillary: 168 mg/dL — ABNORMAL HIGH (ref 65–99)
Glucose-Capillary: 178 mg/dL — ABNORMAL HIGH (ref 65–99)
Glucose-Capillary: 151 mg/dL — ABNORMAL HIGH (ref 65–99)

## 2016-05-06 LAB — URINE CULTURE: CULTURE: NO GROWTH

## 2016-05-06 LAB — MAGNESIUM: Magnesium: 1.3 mg/dL — ABNORMAL LOW (ref 1.7–2.4)

## 2016-05-06 LAB — HEPARIN LEVEL (UNFRACTIONATED)
HEPARIN UNFRACTIONATED: 0.34 [IU]/mL (ref 0.30–0.70)
Heparin Unfractionated: 0.13 IU/mL — ABNORMAL LOW (ref 0.30–0.70)
Heparin Unfractionated: 0.37 IU/mL (ref 0.30–0.70)

## 2016-05-06 LAB — PROCALCITONIN: Procalcitonin: 0.55 ng/mL

## 2016-05-06 LAB — ECHOCARDIOGRAM COMPLETE
HEIGHTINCHES: 68 in
Weight: 3044.11 oz

## 2016-05-06 LAB — PHOSPHORUS: Phosphorus: 1.9 mg/dL — ABNORMAL LOW (ref 2.5–4.6)

## 2016-05-06 MED ORDER — FUROSEMIDE 10 MG/ML IJ SOLN
INTRAMUSCULAR | Status: AC
  Filled 2016-05-06: qty 4

## 2016-05-06 MED ORDER — FENTANYL CITRATE (PF) 2500 MCG/50ML IJ SOLN
50.0000 ug/h | INTRAMUSCULAR | Status: DC
  Administered 2016-05-06 (×2): 150 ug/h via INTRAVENOUS
  Administered 2016-05-07: 250 ug/h via INTRAVENOUS
  Filled 2016-05-06 (×2): qty 50

## 2016-05-06 MED ORDER — SODIUM PHOSPHATES 45 MMOLE/15ML IV SOLN
10.0000 mmol | Freq: Once | INTRAVENOUS | Status: AC
  Administered 2016-05-06: 10 mmol via INTRAVENOUS
  Filled 2016-05-06: qty 3.33

## 2016-05-06 MED ORDER — ATORVASTATIN CALCIUM 80 MG PO TABS
80.0000 mg | ORAL_TABLET | Freq: Every day | ORAL | Status: DC
  Administered 2016-05-07 – 2016-05-11 (×5): 80 mg via ORAL
  Filled 2016-05-06: qty 2
  Filled 2016-05-06 (×4): qty 1

## 2016-05-06 MED ORDER — MAGNESIUM SULFATE 4 GM/100ML IV SOLN
4.0000 g | Freq: Once | INTRAVENOUS | Status: AC
Start: 2016-05-06 — End: 2016-05-06
  Administered 2016-05-06: 4 g via INTRAVENOUS
  Filled 2016-05-06: qty 100

## 2016-05-06 MED ORDER — FENTANYL CITRATE (PF) 100 MCG/2ML IJ SOLN
100.0000 ug | INTRAMUSCULAR | Status: AC | PRN
  Administered 2016-05-07 – 2016-05-08 (×3): 100 ug via INTRAVENOUS
  Filled 2016-05-06 (×2): qty 2

## 2016-05-06 MED ORDER — MIDAZOLAM HCL 2 MG/2ML IJ SOLN
2.0000 mg | INTRAMUSCULAR | Status: DC | PRN
  Administered 2016-05-08: 2 mg via INTRAVENOUS

## 2016-05-06 MED ORDER — FUROSEMIDE 10 MG/ML IJ SOLN
40.0000 mg | Freq: Three times a day (TID) | INTRAMUSCULAR | Status: DC
  Administered 2016-05-06 – 2016-05-07 (×4): 40 mg via INTRAVENOUS
  Filled 2016-05-06 (×3): qty 4

## 2016-05-06 MED ORDER — MIDAZOLAM HCL 2 MG/2ML IJ SOLN
2.0000 mg | INTRAMUSCULAR | Status: DC | PRN
  Administered 2016-05-08: 2 mg via INTRAVENOUS
  Filled 2016-05-06 (×2): qty 2

## 2016-05-06 MED ORDER — TIROFIBAN (AGGRASTAT) BOLUS VIA INFUSION
25.0000 ug/kg | Freq: Once | INTRAVENOUS | Status: AC
  Administered 2016-05-06: 2157.5 ug via INTRAVENOUS
  Filled 2016-05-06: qty 44

## 2016-05-06 MED ORDER — METOPROLOL TARTRATE 25 MG PO TABS
25.0000 mg | ORAL_TABLET | Freq: Two times a day (BID) | ORAL | Status: DC
  Administered 2016-05-06 – 2016-05-07 (×3): 25 mg via ORAL
  Filled 2016-05-06 (×3): qty 1

## 2016-05-06 MED ORDER — TIROFIBAN HCL IN NACL 5-0.9 MG/100ML-% IV SOLN
0.1500 ug/kg/min | INTRAVENOUS | Status: AC
  Administered 2016-05-06 – 2016-05-07 (×4): 0.15 ug/kg/min via INTRAVENOUS
  Filled 2016-05-06 (×5): qty 100

## 2016-05-06 MED ORDER — FENTANYL CITRATE (PF) 100 MCG/2ML IJ SOLN: 100.0000 ug | INTRAMUSCULAR | Status: DC | PRN

## 2016-05-06 MED ORDER — POTASSIUM CHLORIDE 20 MEQ/15ML (10%) PO SOLN
40.0000 meq | Freq: Once | ORAL | Status: AC
  Administered 2016-05-06: 40 meq
  Filled 2016-05-06: qty 30

## 2016-05-06 NOTE — Progress Notes (Signed)
Update  Progress updated  Failed sbot  Plan Back on vent Sedation prn  signifncant other updated  Dr. Brand Males, M.D., [facility].C.P Pulmonary and Critical Care Medicine Staff Physician [place] [facility] Pager: [phone], If no answer or between  15:00h - 7:00h: call [phone]  05/06/2016 9:29 AM

## 2016-05-06 NOTE — H&P (Signed)
PULMONARY / CRITICAL CARE MEDICINE   Name: Gregory Dyer MRN: [id number] DOB: 04/05/1946    ADMISSION DATE:  05/05/2016  REFERRING MD:  ER  CHIEF COMPLAINT:  Short of breath  HISTORY OF PRESENT ILLNESS:   70 yo male developed productive cough 3 days prior to admission.  Over 24 hours prior to admission he had sudden onset of dyspnea that became progressively worse.  He was also having diaphoresis.  In ER he had SpO2 in 50's.  He required intubation.  He had BP 196/113 on arrival to ER, and temperature of 95.9.  WBC and Lactic acid elevated.  CXR showed diffuse, patchy ASD concerning for pneumonia.  There was concern for NSTEMI >> Dr. Terrence Dupont was consulted by EDP, and recommend to start heparin gtt.  He was started on antibiotics in ER for community acquired pneumonia.    STUDIES:  CXR 5/12 > B/L opacities. Images reviewed  CULTURES: 5/12 Blood >> 5/12 Sputum >> 5/12 Pneumococcal Ag >> 5/12 Legionella Ag >> 5/12 Respiratory viral panel >>   ANTIBIOTICS: 5/12 Rocephin >> 5/12 Zithromax >>  SIGNIFICANT EVENTS: 5/12 Admit, cardiology consulted, start heparin gtt  LINES/TUBES: 5/12 ETT >>     SUBJECTIVE/OVERNIGHT/INTERVAL HX 05/06/16 - doing sbt x 20 min, On ver low dose fent gtt - following commands. RN thinks extubatable. Not on pressoprs. No fever. Making urine. On IV heparin gtt. Trop rising  VITAL SIGNS: BP 121/69 mmHg  Pulse 72  Temp(Src) 99.9 F (37.7 C) (Core (Comment))  Resp 26  Ht 5\' 8"  (1.727 m)  Wt 86.3 kg (190 lb 4.1 oz)  BMI 28.94 kg/m2  SpO2 99%  HEMODYNAMICS: CVP:  [5 mmHg-10 mmHg] 10 mmHg  VENTILATOR SETTINGS: Vent Mode:  [-] PRVC FiO2 (%):  [40 %-100 %] 40 % Set Rate:  [20 bmp-26 bmp] 26 bmp Vt Set:  [550 mL] 550 mL PEEP:  [5 cmH20] 5 cmH20 Plateau Pressure:  [18 cmH20-25 cmH20] 24 cmH20  INTAKE / OUTPUT: I/O last 3 completed shifts: In: 226 [I.V.:143.3; NG/GT:82.7] Out: 410 [Urine:410]  PHYSICAL EXAMINATION: General:  Awake, on vent.   Neuro:  Moves all 4 extremities, No focal defects. Follows some commands HEENT:  PERRL, No thyromegaly, JVD Cardiovascular:  RRR, No MRG Lungs:  Sync with vent. No wheeze Abdomen:  Obese, soft, distended, + BS Musculoskeletal:  Normal tone and bulk, no edema. Skin:  Intact  LABS:  PULMONARY  Recent Labs Lab 05/05/16 1645 05/05/16 1830 05/06/16 0500  PHART 7.165* 7.239* 7.389  PCO2ART 62.2* 49.0* 35.1  PO2ART 270* 309* 108*  HCO3 21.5 20.2 20.6  TCO2 19.8 18.3 18.0  O2SAT 98.8 99.2 97.9    CBC  Recent Labs Lab 05/05/16 1545 05/06/16 0500  HGB 17.5* 13.2  HCT 52.7* 39.0  WBC 20.6* 8.5  PLT 187 133*    COAGULATION  Recent Labs Lab 05/05/16 1545  INR 1.06    CARDIAC   Recent Labs Lab 05/05/16 1813 05/05/16 2346 05/06/16 0500  TROPONINI 6.84* 6.88* 7.25*   No results for input(s): PROBNP in the last 168 hours.   CHEMISTRY  Recent Labs Lab 05/05/16 1545 05/06/16 0500  NA 141 141  K 3.5 3.7  CL 103 109  CO2 18* 23  GLUCOSE 215* 117*  BUN 16 17  CREATININE 1.27* 0.99  CALCIUM 9.5 7.6*  MG 2.1 1.3*  PHOS 6.2* 1.9*   Estimated Creatinine Clearance: 75.3 mL/min (by C-G formula based on Cr of 0.99).   LIVER  Recent Labs Lab 05/05/16  1545 05/06/16 0500  AST 58* 34  ALT 17 17  ALKPHOS 78 52  BILITOT 1.1 0.8  PROT 8.0 5.3*  ALBUMIN 4.4 2.9*  INR 1.06  --      INFECTIOUS  Recent Labs Lab 05/05/16 1612 05/05/16 1813 05/05/16 1953 05/06/16 0500  LATICACIDVEN 10.30*  --  1.6  --   PROCALCITON  --  <0.10  --  0.55     ENDOCRINE CBG (last 3)   Recent Labs  05/05/16 1957 05/05/16 2322 05/06/16 0414  GLUCAP 115* 101* 115*         IMAGING x48h  - image(s) personally visualized  -   highlighted in bold Gregory Dyer 1 View  05/06/2016  CLINICAL DATA:  Respiratory failure EXAM: Dyer CHEST 1 VIEW COMPARISON:  05/05/2016 FINDINGS: The ET tube is in good position. The NG tube terminates below today's film. The tip of  the right IJ terminates deep within the right atrium. Recommend withdrawing the line approximately 9 cm into the caval atrial junction. No pneumothorax. Pulmonary edema has improved but persists, a little more focal in the left perihilar region. No other interval changes. IMPRESSION: 1. The right IJ terminates in the right atrium. Recommend withdrawing 9 cm. 2. Improved but persistent pulmonary edema. 3. No other changes. These results will be called to the ordering clinician or representative by the Radiologist Assistant, and communication documented in the PACS or zVision Dashboard. Electronically Signed   By: Dorise Bullion III M.D   On: 05/06/2016 06:56   Gregory Dyer 1 View  05/05/2016  CLINICAL DATA:  Central line placement. Community acquired pneumonia. Respiratory distress. Shortness of breath and productive cough for 3 days. Hypoxia. EXAM: Dyer CHEST 1 VIEW COMPARISON:  05/05/2016 FINDINGS: A new right jugular central venous catheter is seen with tip overlying the inferior aspect of the right atrium, approximately 8 cm below the superior cavoatrial junction. A new nasogastric tube is seen entering the stomach. Endotracheal tube remains in appropriate position. No pneumothorax visualized. Moderate diffuse pulmonary interstitial and airspace disease is seen which is symmetric most likely due to diffuse pulmonary edema. Mild cardiomegaly remains stable. IMPRESSION: New right jugular central venous catheter tip overlies the inferior aspect of the right atrium, approximately 8 cm below the superior cavoatrial junction. No pneumothorax visualized. Stable moderate diffuse pulmonary edema pattern. Electronically Signed   By: Earle Gell M.D.   On: 05/05/2016 19:29   Gregory Dyer 1 View  05/05/2016  CLINICAL DATA:  70 year old male with complaints of shortness of breath, and cough for the past 3 days. EXAM: Dyer CHEST 1 VIEW COMPARISON:  No priors. FINDINGS: An endotracheal tube is in place with  tip 5.7 cm above the carina. Lung volumes appear normal. Diffuse peribronchial cuffing, interstitial prominence in widespread airspace disease throughout the lungs bilaterally. Pulmonary vasculature is obscured. Relative sparing of the lung parenchyma in the periphery of the lungs bilaterally. No definite pleural effusions. Heart size appears upper limits of normal. Upper mediastinal contours are within normal limits. Atherosclerosis in the thoracic aorta. Coronary artery stent incidentally noted. IMPRESSION: 1. Endotracheal tube appears properly located. 2. Diffuse peribronchial cuffing, interstitial prominence and patchy airspace disease throughout the lungs bilaterally, concerning for severe bronchitis and multilobar bronchopneumonia. Findings are not favored to reflect underlying pulmonary edema, although noncardiogenic edema could be considered. 3. Atherosclerosis. Electronically Signed   By: Vinnie Langton M.D.   On: 05/05/2016 16:17   Gregory Abd Dyer 1v  05/05/2016  CLINICAL DATA:  NG tube placement EXAM: Dyer ABDOMEN - 1 VIEW COMPARISON:  None. FINDINGS: There is NG-tube coiled within proximal stomach with tip in mid stomach. Mild gaseous distended small bowel loops in left abdomen. IMPRESSION: NG tube coiled within proximal stomach with tip in mid stomach. Electronically Signed   By: Lahoma Crocker M.D.   On: 05/05/2016 21:01      DISCUSSION: 70 yo male smoker with acute hypoxic/hypercapnic respiratory failure, productive cough, leukocytosis, lactic acidosis, HTN emergency, b/l pulmonary infiltrates concerning for pneumonia, and NSTEMI.  ASSESSMENT / PLAN:  PULMONARY A: Acute hypoxic/hypercapnic respiratory failure 2nd to b/l pulmonary infiltrates concerning for CAP +/- acute pulmonary edema. Tobacco abuse.   - going to meet extubation criteria P:   [facility] Full vent support and extubate if meets criteria F/u CXR, ABG Prn duoneb  CARDIOVASCULAR A:  HTN emergency. NSTEMI likely from  demand ischemia.  - rising trop manintaing BP. D/w Dr Terrence Dupont - continue med mgmt. Plan cath in 48-72h.. Denies chest pain off fent gtt  P:  Heparin gtt per cardiology L:asix scheduled start Cath in 48-72h per Dr Duane Lope Await, Echo ASA   RENAL  Recent Labs Lab 05/05/16 1545 05/06/16 0500  CREATININE 1.27* 0.99     A:   AKI - improvmed But Hypokalemia Hypomagnesemia Hypophosphatemia  P:   Replete mag, phos, K  GASTROINTESTINAL A:   Nutrition. P:   DC Tube feedsfor extubation  HEMATOLOGIC A:   Leukocytosis, polycythemia. P:  F/u CBC  INFECTIOUS A:   Sepsis from community acquired pneumonia. P:   Day2 of rocephin, zithromax F/U mico [id number]  ENDOCRINE A:   Hyperglycemia >> no reported hx of DM. P:   SSI  NEUROLOGIC A:   Acute encephalopathy 2nd to respiratory failure, sepsis/pneumonia.  - following commands and calm on SBT 05/06/16  P:   RASS goal: -1 Dc sedationg ttn. F/u urine drug screen  FAMILY  - Updates: Fiance updated at bedside 5/12. Pt is estranged from his family. None at bedside 05/06/2016  - Inter-disciplinary family meet or Palliative Care meeting due by:  5/19    The patient is critically ill with multiple organ systems failure and requires high complexity decision making for assessment and support, frequent evaluation and titration of therapies, application of advanced monitoring technologies and extensive interpretation of multiple databases.   Critical Care Time devoted to patient care services described in this note is  30  Minutes. This time reflects time of care of this signee Dr Brand Males. This critical care time does not reflect procedure time, or teaching time or supervisory time of PA/NP/Med student/Med Resident etc but could involve care discussion time    Dr. Brand Males, M.D., [facility].C.P Pulmonary and Critical Care Medicine Staff Physician [place] [facility]  [facility] Pager: [phone], If no answer or between  15:00h - 7:00h: call [phone]  05/06/2016 8:38 AM

## 2016-05-06 NOTE — Progress Notes (Signed)
Pharmacy Consult Note - IV heparin/Aggrastat  Labs: heparin level 0.37  A/P: Heparin level therapeutic (goal 0.3-0.5 while also on Aggrastat). Continue current rate of 1250 units/hr. Will confirm goal rate at 2000 this evening and recheck a CBC. Per RN, had some blood tinged sputum this AM - will monitor  Adrian Saran, PharmD, BCPS Pager [phone] 05/06/2016 2:53 PM

## 2016-05-06 NOTE — Consult Note (Addendum)
CARDIOLOGY CONSULT NOTE  Patient ID: Gregory Dyer MRN: [id number] DOB/AGE: 70/02/1946 70 y.o.  Admit date: 05/05/2016 Referring Physician  M. Terrence Dupont, MD Primary Physician:  No primary care provider on file. Reason for Consultation  NSTEMI and evaluate need for coronary angiogram  HPI: Gregory Dyer  is a 70 y.o. male  With History of tobacco use disorder, hypertension, hyperlipidemia who was admitted to the hospital with 2-3 days onset of cough, also states that he has been having chest pain for the past 2 days prior to the hospital admission.  Patient is presently intubated but is able to converse by nodding his head appropriately. States that he was having chest pain for 2 days on and off, described as very mild, but also developed cough and shortness of breath that made him come to the emergency room. Due to respiratory distress he was intubated. Initial troponin was negative, then troponins were positive with mildly abnormal EKG. He was also severely acidemic with hypoxemia on presentation. Patient has received intravenous Lasix today with significant diuresis, presently denies any chest pain. History is limited due to patient being intubated. He states that since being in the hospital he has not had any further chest pains.  He also complains of cramping of his right leg worse than the left leg, states that activity brings on severe cramps in his calves.   Past Medical History  Diagnosis Date  . Hypertension      Past Surgical History  Procedure Laterality Date  . Hernia repair       History reviewed. No pertinent family history.   Social History: Social History   Social History  . Marital Status: Single    Spouse Name: N/A  . Number of Children: N/A  . Years of Education: N/A   Occupational History  . Not on file.   Social History Main Topics  . Smoking status: Current Every Day Smoker -- 1.00 packs/day    Types: Cigarettes  . Smokeless tobacco: Never Used  .  Alcohol Use: 8.4 oz/week    14 Shots of liquor per week     Comment: drinks 2 glasses of Jim [employer]am and coke per day  . Drug Use: No  . Sexual Activity: Yes   Other Topics Concern  . Not on file   Social History Narrative  . No narrative on file     Prescriptions prior to admission  Medication Sig Dispense Refill Last Dose  . albuterol (PROVENTIL HFA;VENTOLIN HFA) 108 (90 Base) MCG/ACT inhaler Inhale 1 puff into the lungs 2 (two) times daily as needed for wheezing or shortness of breath.   05/04/2016 at pm  . aspirin 81 MG chewable tablet Chew 81 mg by mouth daily.   05/05/2016 at Unknown time  . Multiple Vitamin (MULTIVITAMIN WITH MINERALS) TABS tablet Take 1 tablet by mouth daily.   05/05/2016 at Unknown time  . PRESCRIPTION MEDICATION Blood pressure medication.   January 2017   ROS: General: no fevers/chills/night sweats Eyes: no blurry vision, diplopia, or amaurosis ENT: no sore throat or hearing loss Resp: Positive for  Cough,no wheezing, or hemoptysis CV: no edema or palpitations. Present: CP GI: no abdominal pain, nausea, vomiting, diarrhea, or constipation GU: no dysuria, frequency, or hematuria Skin: no rash Neuro: no headache, numbness, tingling, or weakness of extremities Musculoskeletal: no joint pain or swelling Heme: no bleeding, DVT, or easy bruising Endo: no polydipsia or polyuria    Physical Exam: Blood pressure 111/68, pulse 81, temperature 99.5 F (37.5 C),  temperature source Core (Comment), resp. rate 26, height _0  (1.727 m), weight 86.3 kg (190 lb 4.1 oz), SpO2 100 %.   General appearance: alert, cooperative, appears stated age, no distress and intubated Lungs: clear to auscultation bilaterally Heart: regular rate and rhythm, S1, S2 normal, no murmur, click, rub or gallop Abdomen: soft, non-tender; bowel sounds normal; no masses,  no organomegaly Extremities: extremities normal, atraumatic, no cyanosis or edema Pulses: Bilateral carotid no bruit. Femoral  pulses feeble, no bruit appreciated. Popliteal pulse on the right is very faint, left is 2+. Right dorsalis pedis absent, right posterior tibial absent, left dorsalis pedis 2+. Left posterior tibial absent.  Labs:   CBC Latest Ref Rng 05/06/2016 05/05/2016  WBC 4.0 - 10.5 K/uL 8.5 20.6(H)  Hemoglobin 13.0 - 17.0 g/dL 13.2 17.5(H)  Hematocrit 39.0 - 52.0 % 39.0 52.7(H)  Platelets 150 - 400 K/uL 133(L) 187      Lab Results  Component Value Date   WBC 8.5 05/06/2016   HGB 13.2 05/06/2016   HCT 39.0 05/06/2016   MCV 96.3 05/06/2016   PLT 133* 05/06/2016    Recent Labs Lab 05/06/16 0500  NA 141  K 3.7  CL 109  CO2 23  BUN 17  CREATININE 0.99  CALCIUM 7.6*  PROT 5.3*  BILITOT 0.8  ALKPHOS 52  ALT 17  AST 34  GLUCOSE 117*    Lipid Panel     Component Value Date/Time   CHOL 150 05/06/2016 0500   TRIG 102 05/06/2016 0500   HDL 26* 05/06/2016 0500   CHOLHDL 5.8 05/06/2016 0500   VLDL 20 05/06/2016 0500   LDLCALC 104* 05/06/2016 0500     Recent Labs  05/05/16 1545  BNP 1255.8*    Recent Labs  05/06/16 0500 05/06/16 1112 05/06/16 1700  TROPONINI 7.25* 6.93* 5.93*    Lab Results  Component Value Date   TROPONINI 5.93* 05/06/2016     EKG: 05/05/2016: Normal sinus rhythm, nonspecific T abnormality. 05/06/2016: Sinus tachycardia at the rate of 100 bpm, nonspecific coving ST segments without ST elevation inferiorly and laterally, T-wave inversion in the inferior and lateral leads, cannot exclude ischemia.  Echo: LVEF 35-40%, inferior hypokinesis, moderate mitral regurgitation. Mitral valve area was cannulated at 1.4 cm however mean pressure gradient was only 3 mmHg. Suspect discrepancy in measurements.   Radiology: [name] 1 View  05/06/2016  CLINICAL DATA:  Repositioning of central venous catheter. EXAM: PORTABLE CHEST 1 VIEW COMPARISON:  05/06/2016 and 05/05/2016 FINDINGS: Endotracheal tube and enteric tube unchanged. Right IJ central venous catheter  has been pulled back as tip is just below the cavoatrial junction. Lungs are adequately inflated with worsening bilateral perihilar opacification compatible with edema. Remainder of the exam is unchanged. IMPRESSION: Worsening interstitial edema. Tubes and lines as described. Note that the right IJ central venous catheter has been pulled back slightly as tip is just below the cavoatrial junction. Electronically Signed   By: Marin Olp M.D.   On: 05/06/2016 12:08   [name] 1 View  05/06/2016  CLINICAL DATA:  Respiratory failure EXAM: PORTABLE CHEST 1 VIEW COMPARISON:  05/05/2016 FINDINGS: The ET tube is in good position. The NG tube terminates below today's film. The tip of the right IJ terminates deep within the right atrium. Recommend withdrawing the line approximately 9 cm into the caval atrial junction. No pneumothorax. Pulmonary edema has improved but persists, a little more focal in the left perihilar region. No other interval changes. IMPRESSION: 1.  The right IJ terminates in the right atrium. Recommend withdrawing 9 cm. 2. Improved but persistent pulmonary edema. 3. No other changes. These results will be called to the ordering clinician or representative by the Radiologist Assistant, and communication documented in the PACS or zVision Dashboard. Electronically Signed   By: Dorise Bullion III M.D   On: 05/06/2016 06:56   [name] 1 View  05/05/2016  CLINICAL DATA:  Central line placement. Community acquired pneumonia. Respiratory distress. Shortness of breath and productive cough for 3 days. Hypoxia. EXAM: PORTABLE CHEST 1 VIEW COMPARISON:  05/05/2016 FINDINGS: A new right jugular central venous catheter is seen with tip overlying the inferior aspect of the right atrium, approximately 8 cm below the superior cavoatrial junction. A new nasogastric tube is seen entering the stomach. Endotracheal tube remains in appropriate position. No pneumothorax visualized. Moderate diffuse pulmonary  interstitial and airspace disease is seen which is symmetric most likely due to diffuse pulmonary edema. Mild cardiomegaly remains stable. IMPRESSION: New right jugular central venous catheter tip overlies the inferior aspect of the right atrium, approximately 8 cm below the superior cavoatrial junction. No pneumothorax visualized. Stable moderate diffuse pulmonary edema pattern. Electronically Signed   By: Earle Gell M.D.   On: 05/05/2016 19:29   Dg Chest Portable 1 View  05/05/2016  CLINICAL DATA:  70 year old male with complaints of shortness of breath, and cough for the past 3 days. EXAM: PORTABLE CHEST 1 VIEW COMPARISON:  No priors. FINDINGS: An endotracheal tube is in place with tip 5.7 cm above the carina. Lung volumes appear normal. Diffuse peribronchial cuffing, interstitial prominence in widespread airspace disease throughout the lungs bilaterally. Pulmonary vasculature is obscured. Relative sparing of the lung parenchyma in the periphery of the lungs bilaterally. No definite pleural effusions. Heart size appears upper limits of normal. Upper mediastinal contours are within normal limits. Atherosclerosis in the thoracic aorta. Coronary artery stent incidentally noted. IMPRESSION: 1. Endotracheal tube appears properly located. 2. Diffuse peribronchial cuffing, interstitial prominence and patchy airspace disease throughout the lungs bilaterally, concerning for severe bronchitis and multilobar bronchopneumonia. Findings are not favored to reflect underlying pulmonary edema, although noncardiogenic edema could be considered. 3. Atherosclerosis. Electronically Signed   By: Vinnie Langton M.D.   On: 05/05/2016 16:17   Dg Abd Portable 1v  05/05/2016  CLINICAL DATA:  NG tube placement EXAM: PORTABLE ABDOMEN - 1 VIEW COMPARISON:  None. FINDINGS: There is NG-tube coiled within proximal stomach with tip in mid stomach. Mild gaseous distended small bowel loops in left abdomen. IMPRESSION: NG tube coiled  within proximal stomach with tip in mid stomach. Electronically Signed   By: Lahoma Crocker M.D.   On: 05/05/2016 21:01    Scheduled Meds: . antiseptic oral rinse  7 mL Mouth Rinse 10 times per day  . aspirin  81 mg Oral Daily  . azithromycin  500 mg Intravenous Q24H  . cefTRIAXone (ROCEPHIN)  IV  1 g Intravenous Q24H  . chlorhexidine gluconate (SAGE KIT)  15 mL Mouth Rinse BID  . feeding supplement (VITAL HIGH PROTEIN)  1,000 mL Per Tube Q24H  . furosemide      . furosemide  40 mg Intravenous Q8H  . insulin aspart  0-20 Units Subcutaneous Q4H  . pantoprazole sodium  40 mg Per Tube Daily   Continuous Infusions: . fentaNYL infusion INTRAVENOUS 150 mcg/hr (05/06/16 1210)  . heparin 1,250 Units/hr (05/06/16 1800)  . tirofiban 0.15 mcg/kg/min (05/06/16 1532)   PRN Meds:.fentaNYL (SUBLIMAZE) injection, fentaNYL (SUBLIMAZE)  injection, ipratropium-albuterol, midazolam, midazolam  ASSESSMENT AND PLAN:  1. Non-ST elevation MI. 2. Acute pulmonary edema 3. History of tobacco use disorder 4. Hypertension  5. Hyperlipidemia 6. Symptoms suggestive of peripheral arterial disease with claudication involving the right leg worse than the left with mildly abnormal physical exam.  Recommendation: Serum troponin is trending down, peak troponin around 7. He is presently on IV heparin and also Aggrastat, continue the same over the weekend. He will need coronary angiography and possible angioplasty on Monday. If he develops unstable hemodynamics are chest discomfort, he'll be taken to the catheterization lab on a emergent basis. I have discussed with Dr. Chase Caller, will discontinue antibiotics. I have discussed with the patient regarding need for coronary angiography, in a limited fashion he understands that he will need his heart checked, this can be updated once he is extubated. I will check A1c and TSH.  I will start the patient on low-dose of beta blocker, blood pressure stable. Also start statin therapy.  Mildly reduced platelets noted, will need continued surveillance. I will see the patient on Monday, further management is per Dr. Terrence Dupont.   Adrian Prows, MD, [facility] 05/06/2016, 6:21 PM [facility] [place] Pager: [phone] Office: [phone] If no answer Cell [phone]

## 2016-05-06 NOTE — Progress Notes (Signed)
RT notified by RN to return patient back to full support due to patient not tolerating well. Upon arrival patient was dusky and O2 sats not reading well. Patient FiO2 increased to 100% with little improvement to saturations; PEEP titrated up to +8 at this time (O2 sats of 93%). RN with RT at bedside. Patient to be titrated on PEEP, then FiO2 as tolerated. RT will continue to monitor patient and notify CCM.

## 2016-05-06 NOTE — Progress Notes (Signed)
ANTICOAGULATION CONSULT NOTE - Follow Up Consult  Pharmacy Consult for Heparin Indication: chest pain/ACS  Allergies  Allergen Reactions  . No Known Allergies     Patient Measurements: Height: 5\' 8"  (172.7 cm) Weight: 190 lb 4.1 oz (86.3 kg) IBW/kg (Calculated) : 68.4 Heparin Dosing Weight:   Vital Signs: Temp: 99 F (37.2 C) (05/13 0000) Temp Source: Core (Comment) (05/13 0000) BP: 112/63 mmHg (05/13 0000) Pulse Rate: 68 (05/13 0000)  Labs:  Recent Labs  05/05/16 1545 05/05/16 1813 05/05/16 2300 05/05/16 2346  HGB 17.5*  --   --   --   HCT 52.7*  --   --   --   PLT 187  --   --   --   APTT 28  --   --   --   LABPROT 14.0  --   --   --   INR 1.06  --   --   --   HEPARINUNFRC  --   --  0.16*  --   CREATININE 1.27*  --   --   --   TROPONINI  --  6.84*  --  6.88*    Estimated Creatinine Clearance: 58.7 mL/min (by C-G formula based on Cr of 1.27).   Medications:  Infusions:  . fentaNYL infusion INTRAVENOUS 100 mcg/hr (05/06/16 0000)  . heparin 1,100 Units/hr (05/06/16 0000)    Assessment: Patient with low heparin level.  However, RN informed me that heparin was off until ~8pm.  Will consider low level as not steady state.  Will retime level with AM labs.  Goal of Therapy:  Heparin level 0.3-0.7 units/ml Monitor platelets by anticoagulation protocol: Yes   Plan:  Recheck level with AM labs  Tyler Deis, Shea Stakes [place] 05/06/2016,1:00 AM

## 2016-05-06 NOTE — Progress Notes (Signed)
Subjective:  Patient is awake intubated denies any chest pain. EKG this morning shows minimal ST elevation in inferior leads and troponin I has slightly trended up. 2-D echo showed EF approximately 35-40% with inferolateral basal and mid severe hypokinesia. Patient did not tolerate extubation earlier this morning.  Objective:  Vital Signs in the last 24 hours: Temp:  [94.1 F (34.5 C)-100.2 F (37.9 C)] 100.2 F (37.9 C) (05/13 1000) Pulse Rate:  [44-137] 44 (05/13 1000) Resp:  [12-27] 26 (05/13 1000) BP: (67-196)/(52-113) 90/58 mmHg (05/13 1000) SpO2:  [85 %-100 %] 98 % (05/13 1020) FiO2 (%):  [40 %-100 %] 80 % (05/13 1020) Weight:  [86.3 kg (190 lb 4.1 oz)-90.719 kg (200 lb)] 86.3 kg (190 lb 4.1 oz) (05/13 0353)  Intake/Output from previous day: 05/12 0701 - 05/13 0700 In: 226 [I.V.:143.3; NG/GT:82.7] Out: 410 [Urine:410] Intake/Output from this shift: Total I/O In: 521.5 [I.V.:217.5; NG/GT:120; IV Piggyback:184] Out: 500 [Urine:500]  Physical Exam: Neck: no adenopathy, no carotid bruit, no JVD and supple, symmetrical, trachea midline Lungs: Bilateral rhonchi and rales Heart: regular rate and rhythm, S1, S2 normal and Soft systolic murmur and S3 gallop noted Abdomen: soft, non-tender; bowel sounds normal; no masses,  no organomegaly Extremities: extremities normal, atraumatic, no cyanosis or edema  Lab Results:  Recent Labs  05/05/16 1545 05/06/16 0500  WBC 20.6* 8.5  HGB 17.5* 13.2  PLT 187 133*    Recent Labs  05/05/16 1545 05/06/16 0500  NA 141 141  K 3.5 3.7  CL 103 109  CO2 18* 23  GLUCOSE 215* 117*  BUN 16 17  CREATININE 1.27* 0.99    Recent Labs  05/05/16 2346 05/06/16 0500  TROPONINI 6.88* 7.25*   Hepatic Function Panel  Recent Labs  05/06/16 0500  PROT 5.3*  ALBUMIN 2.9*  AST 34  ALT 17  ALKPHOS 52  BILITOT 0.8    Recent Labs  05/06/16 0500  CHOL 150   No results for input(s): PROTIME in the last 72 hours.  Imaging: Imaging  results have been reviewed and [name] 1 View  05/06/2016  CLINICAL DATA:  Respiratory failure EXAM: PORTABLE CHEST 1 VIEW COMPARISON:  05/05/2016 FINDINGS: The ET tube is in good position. The NG tube terminates below today's film. The tip of the right IJ terminates deep within the right atrium. Recommend withdrawing the line approximately 9 cm into the caval atrial junction. No pneumothorax. Pulmonary edema has improved but persists, a little more focal in the left perihilar region. No other interval changes. IMPRESSION: 1. The right IJ terminates in the right atrium. Recommend withdrawing 9 cm. 2. Improved but persistent pulmonary edema. 3. No other changes. These results will be called to the ordering clinician or representative by the Radiologist Assistant, and communication documented in the PACS or zVision Dashboard. Electronically Signed   By: Dorise Bullion III M.D   On: 05/06/2016 06:56   [name] 1 View  05/05/2016  CLINICAL DATA:  Central line placement. Community acquired pneumonia. Respiratory distress. Shortness of breath and productive cough for 3 days. Hypoxia. EXAM: PORTABLE CHEST 1 VIEW COMPARISON:  05/05/2016 FINDINGS: A new right jugular central venous catheter is seen with tip overlying the inferior aspect of the right atrium, approximately 8 cm below the superior cavoatrial junction. A new nasogastric tube is seen entering the stomach. Endotracheal tube remains in appropriate position. No pneumothorax visualized. Moderate diffuse pulmonary interstitial and airspace disease is seen which is symmetric most likely due to diffuse  pulmonary edema. Mild cardiomegaly remains stable. IMPRESSION: New right jugular central venous catheter tip overlies the inferior aspect of the right atrium, approximately 8 cm below the superior cavoatrial junction. No pneumothorax visualized. Stable moderate diffuse pulmonary edema pattern. Electronically Signed   By: Earle Gell M.D.   On: 05/05/2016  19:29   Dg Chest Portable 1 View  05/05/2016  CLINICAL DATA:  70 year old male with complaints of shortness of breath, and cough for the past 3 days. EXAM: PORTABLE CHEST 1 VIEW COMPARISON:  No priors. FINDINGS: An endotracheal tube is in place with tip 5.7 cm above the carina. Lung volumes appear normal. Diffuse peribronchial cuffing, interstitial prominence in widespread airspace disease throughout the lungs bilaterally. Pulmonary vasculature is obscured. Relative sparing of the lung parenchyma in the periphery of the lungs bilaterally. No definite pleural effusions. Heart size appears upper limits of normal. Upper mediastinal contours are within normal limits. Atherosclerosis in the thoracic aorta. Coronary artery stent incidentally noted. IMPRESSION: 1. Endotracheal tube appears properly located. 2. Diffuse peribronchial cuffing, interstitial prominence and patchy airspace disease throughout the lungs bilaterally, concerning for severe bronchitis and multilobar bronchopneumonia. Findings are not favored to reflect underlying pulmonary edema, although noncardiogenic edema could be considered. 3. Atherosclerosis. Electronically Signed   By: Vinnie Langton M.D.   On: 05/05/2016 16:17   Dg Abd Portable 1v  05/05/2016  CLINICAL DATA:  NG tube placement EXAM: PORTABLE ABDOMEN - 1 VIEW COMPARISON:  None. FINDINGS: There is NG-tube coiled within proximal stomach with tip in mid stomach. Mild gaseous distended small bowel loops in left abdomen. IMPRESSION: NG tube coiled within proximal stomach with tip in mid stomach. Electronically Signed   By: Lahoma Crocker M.D.   On: 05/05/2016 21:01    Cardiac Studies:  Assessment/Plan:  Acute non-Q-wave myocardial infarction complicated by acute pulmonary edema Acute respiratory failure secondary to above/possible bilateral pneumonia Status post hypertensive emergency Tobacco abuse Hypomagnesemia Plan Discussed with Dr. Einar Gip regarding new EKG findings and elevated  troponin I. Will start on Integrilin. Will treat conservatively and stabilize patient first and possibly cath possible angioplasty on Monday unless patient develops chest pain or further EKG changes or worsening hemodynamic parameters. Will recheck serial enzymes and EKG. Discussed with patient and his fiance at bedside and agrees  LOS: 1 day    [name] 05/06/2016, 11:00 AM

## 2016-05-06 NOTE — Progress Notes (Signed)
ANTICOAGULATION CONSULT NOTE - Follow up [place] for Heparin and Aggrastat Indication: chest pain/ACS  Allergies  Allergen Reactions  . No Known Allergies     Patient Measurements: Height: 5\' 8"  (172.7 cm) Weight: 190 lb 4.1 oz (86.3 kg) IBW/kg (Calculated) : 68.4 Heparin Dosing Weight: 87 kg  Vital Signs: Temp: 99.3 F (37.4 C) (05/13 1100) Temp Source: Core (Comment) (05/13 0400) BP: 94/80 mmHg (05/13 1121) Pulse Rate: 92 (05/13 1121)  Labs:  Recent Labs  05/05/16 1545 05/05/16 1813 05/05/16 2300 05/05/16 2346 05/06/16 0500  HGB 17.5*  --   --   --  13.2  HCT 52.7*  --   --   --  39.0  PLT 187  --   --   --  133*  APTT 28  --   --   --   --   LABPROT 14.0  --   --   --   --   INR 1.06  --   --   --   --   HEPARINUNFRC  --   --  0.16*  --  0.13*  CREATININE 1.27*  --   --   --  0.99  TROPONINI  --  6.84*  --  6.88* 7.25*    Estimated Creatinine Clearance: 75.3 mL/min (by C-G formula based on Cr of 0.99).   Medical History: Past Medical History  Diagnosis Date  . Hypertension     Assessment: 55 yoM presents 5/12 with shortness of breath with productive cough over 3 days.  Denied chest pain.  Pt hypoxic with respiratory failure and intubated.  Also in hypertensive emergency.  Pharmacy consulted to start heparin infusion for possible ACS.  No history of anticoagulants on file.  Today, 05/06/2016: - Cardiology consulted and added Aggrastat d/t EKG changes and troponin trending up. Planning cardiac cath 5/15 or sooner if further changes or chest pain. - CBC wnl except platelets have trended down. No bleeding reported/documented. - SCr wnl. CrCl 98ml/min. - Cont on home aspirin 81mg  daily, confirmed with cardiology.  Goal of Therapy:  Heparin level: 0.3-0.5 while on Aggrastat Monitor platelets by anticoagulation protocol: Yes   Plan:  Start Aggrastat with 16mcg/kg bolus and 0.62mcg/kg/min infusion. Cont heparin at 1250units/hr. Next  heparin level at 1400. Daily CBC and heparin level while on heparin infusion.  Romeo Rabon, PharmD, pager [phone]. 05/06/2016,11:46 AM.

## 2016-05-06 NOTE — Progress Notes (Signed)
  Echocardiogram 2D Echocardiogram has been performed.  Gregory Dyer M 05/06/2016, 8:22 AM

## 2016-05-07 ENCOUNTER — Inpatient Hospital Stay (HOSPITAL_COMMUNITY): Payer: Medicare Other

## 2016-05-07 ENCOUNTER — Other Ambulatory Visit: Payer: Self-pay

## 2016-05-07 LAB — CBC
HCT: 37.9 % — ABNORMAL LOW (ref 39.0–52.0)
Hemoglobin: 12.9 g/dL — ABNORMAL LOW (ref 13.0–17.0)
MCH: 32.7 pg (ref 26.0–34.0)
MCHC: 34 g/dL (ref 30.0–36.0)
MCV: 95.9 fL (ref 78.0–100.0)
PLATELETS: 134 10*3/uL — AB (ref 150–400)
RBC: 3.95 MIL/uL — ABNORMAL LOW (ref 4.22–5.81)
RDW: 13.5 % (ref 11.5–15.5)
WBC: 7.8 10*3/uL (ref 4.0–10.5)

## 2016-05-07 LAB — PROCALCITONIN: PROCALCITONIN: 0.69 ng/mL

## 2016-05-07 LAB — BASIC METABOLIC PANEL
Anion gap: 7 (ref 5–15)
BUN: 27 mg/dL — ABNORMAL HIGH (ref 6–20)
CALCIUM: 7.4 mg/dL — AB (ref 8.9–10.3)
CO2: 24 mmol/L (ref 22–32)
CREATININE: 1.21 mg/dL (ref 0.61–1.24)
Chloride: 111 mmol/L (ref 101–111)
GFR calc Af Amer: >60 mL/min (ref >60)
GFR calc non Af Amer: 59 mL/min — ABNORMAL LOW (ref >60)
GLUCOSE: 149 mg/dL — AB (ref 65–99)
Potassium: 3.6 mmol/L (ref 3.5–5.1)
Sodium: 142 mmol/L (ref 135–145)

## 2016-05-07 LAB — MAGNESIUM: Magnesium: 2.1 mg/dL (ref 1.7–2.4)

## 2016-05-07 LAB — HEPARIN LEVEL (UNFRACTIONATED): HEPARIN UNFRACTIONATED: 0.34 [IU]/mL (ref 0.30–0.70)

## 2016-05-07 LAB — LACTIC ACID, PLASMA: Lactic Acid, Venous: 1.3 mmol/L (ref 0.5–2.0)

## 2016-05-07 LAB — GLUCOSE, CAPILLARY
GLUCOSE-CAPILLARY: 126 mg/dL — AB (ref 65–99)
GLUCOSE-CAPILLARY: 188 mg/dL — AB (ref 65–99)
GLUCOSE-CAPILLARY: 195 mg/dL — AB (ref 65–99)
Glucose-Capillary: 138 mg/dL — ABNORMAL HIGH (ref 65–99)
Glucose-Capillary: 148 mg/dL — ABNORMAL HIGH (ref 65–99)
Glucose-Capillary: 106 mg/dL — ABNORMAL HIGH (ref 65–99)

## 2016-05-07 LAB — TSH: TSH: 1.13 u[IU]/mL (ref 0.350–4.500)

## 2016-05-07 LAB — LEGIONELLA PNEUMOPHILA SEROGP 1 UR AG: L. PNEUMOPHILA SEROGP 1 UR AG: NEGATIVE

## 2016-05-07 LAB — TRIGLYCERIDES: TRIGLYCERIDES: 130 mg/dL (ref <150)

## 2016-05-07 LAB — TROPONIN I: TROPONIN I: 5.02 ng/mL — AB (ref <0.031)

## 2016-05-07 MED ORDER — PROPOFOL 1000 MG/100ML IV EMUL
INTRAVENOUS | Status: AC
  Administered 2016-05-07: 1000 mg
  Filled 2016-05-07: qty 100

## 2016-05-07 MED ORDER — FUROSEMIDE 10 MG/ML IJ SOLN
60.0000 mg | Freq: Three times a day (TID) | INTRAMUSCULAR | Status: DC
  Filled 2016-05-07: qty 6

## 2016-05-07 MED ORDER — FENTANYL CITRATE (PF) 100 MCG/2ML IJ SOLN
50.0000 ug | INTRAMUSCULAR | Status: DC | PRN
  Administered 2016-05-07: 200 ug via INTRAVENOUS
  Administered 2016-05-08: 100 ug via INTRAVENOUS
  Filled 2016-05-07 (×2): qty 4

## 2016-05-07 MED ORDER — TIROFIBAN HCL IV 12.5 MG/250 ML
0.1500 ug/kg/min | INTRAVENOUS | Status: DC
  Administered 2016-05-07: 0.15 ug/kg/min via INTRAVENOUS
  Filled 2016-05-07 (×2): qty 250
  Filled 2016-05-07: qty 100

## 2016-05-07 MED ORDER — ONDANSETRON HCL 4 MG/2ML IJ SOLN
INTRAMUSCULAR | Status: AC
  Administered 2016-05-07: 8 mg
  Filled 2016-05-07: qty 4

## 2016-05-07 MED ORDER — PROPOFOL 1000 MG/100ML IV EMUL
5.0000 ug/kg/min | INTRAVENOUS | Status: DC
  Administered 2016-05-07 (×2): 30 ug/kg/min via INTRAVENOUS
  Filled 2016-05-07: qty 100

## 2016-05-07 MED ORDER — POTASSIUM CHLORIDE 20 MEQ/15ML (10%) PO SOLN
40.0000 meq | Freq: Once | ORAL | Status: AC
  Administered 2016-05-07: 40 meq
  Filled 2016-05-07: qty 30

## 2016-05-07 MED ORDER — DOXYCYCLINE CALCIUM 50 MG/5ML PO SYRP
100.0000 mg | ORAL_SOLUTION | Freq: Two times a day (BID) | ORAL | Status: DC
  Administered 2016-05-07 (×2): 100 mg
  Filled 2016-05-07 (×4): qty 10

## 2016-05-07 MED ORDER — IPRATROPIUM-ALBUTEROL 0.5-2.5 (3) MG/3ML IN SOLN
3.0000 mL | RESPIRATORY_TRACT | Status: DC
  Administered 2016-05-07 – 2016-05-12 (×28): 3 mL via RESPIRATORY_TRACT
  Filled 2016-05-07 (×28): qty 3

## 2016-05-07 MED ORDER — SODIUM CHLORIDE 0.9 % IV SOLN
8.0000 mg | Freq: Once | INTRAVENOUS | Status: DC
  Filled 2016-05-07: qty 4

## 2016-05-07 MED ORDER — PRO-STAT SUGAR FREE PO LIQD
60.0000 mL | Freq: Two times a day (BID) | ORAL | Status: DC
  Administered 2016-05-07 – 2016-05-09 (×4): 60 mL
  Filled 2016-05-07 (×4): qty 60

## 2016-05-07 MED ORDER — AMIODARONE LOAD VIA INFUSION
150.0000 mg | Freq: Once | INTRAVENOUS | Status: AC
  Administered 2016-05-07: 150 mg via INTRAVENOUS
  Filled 2016-05-07: qty 83.34

## 2016-05-07 MED ORDER — FUROSEMIDE 10 MG/ML IJ SOLN
60.0000 mg | Freq: Three times a day (TID) | INTRAMUSCULAR | Status: DC
  Administered 2016-05-07 – 2016-05-10 (×9): 60 mg via INTRAVENOUS
  Filled 2016-05-07 (×9): qty 6

## 2016-05-07 MED ORDER — MAGNESIUM SULFATE 2 GM/50ML IV SOLN
2.0000 g | Freq: Once | INTRAVENOUS | Status: AC
  Administered 2016-05-07: 2 g via INTRAVENOUS
  Filled 2016-05-07: qty 50

## 2016-05-07 MED ORDER — VITAL 1.5 CAL PO LIQD
1000.0000 mL | ORAL | Status: DC
  Administered 2016-05-07 – 2016-05-08 (×2): 1000 mL
  Filled 2016-05-07 (×4): qty 1000

## 2016-05-07 MED ORDER — AMIODARONE HCL IN DEXTROSE 360-4.14 MG/200ML-% IV SOLN
60.0000 mg/h | INTRAVENOUS | Status: AC
  Administered 2016-05-07 – 2016-05-08 (×2): 60 mg/h via INTRAVENOUS
  Filled 2016-05-07: qty 200

## 2016-05-07 MED ORDER — AMIODARONE HCL IN DEXTROSE 360-4.14 MG/200ML-% IV SOLN
15.0000 mg/h | INTRAVENOUS | Status: DC
  Administered 2016-05-08 (×2): 30 mg/h via INTRAVENOUS
  Administered 2016-05-09: 15 mg/h via INTRAVENOUS
  Filled 2016-05-07 (×4): qty 200

## 2016-05-07 NOTE — Progress Notes (Signed)
Patient oozing from R IJ site of CVC.  Guaze dressing applied to site currently.  Notified Dr. Onnie Graham and Dr. Terrence Dupont of oozing at CVC site.  Heparin d/c'd per order.  Kelcey Korus Roselie Awkward RN

## 2016-05-07 NOTE — Progress Notes (Signed)
RT attempted 2nd SBT; patient not on gtt. Patient was tolerating well, but had increased WOB with desaturations into the 70's during SBT. Patient returned back to full support at this time. Patient more comfortable and FiO2 will be titrated as tolerated. RN aware. RT will continue to monitor.

## 2016-05-07 NOTE — Progress Notes (Signed)
Patient had a repeat episode similar to 05/06/16 and was returned to fullsupport settings. Patient desaturated to the mid 70's while on wean and was titrated to achieve an O2 sat of 92% or greater (FiO2 100%/PEEP +8). Patient is alert and follows commands, but cannot maintain O2 sats while on SBT and appears uncomfortable. Patient more comfortable at this time; PEEP and FiO2 to be titrated as tolerated by patient. RN aware; RT will continue to monitor patient.

## 2016-05-07 NOTE — Progress Notes (Signed)
  Amiodarone Drug - Drug Interaction Consult Note  Recommendations: Dr. Brand Males  Amiodarone is metabolized by the cytochrome P450 system and therefore has the potential to cause many drug interactions. Amiodarone has an average plasma half-life of 50 days (range 20 to 100 days).   There is potential for drug interactions to occur several weeks or months after stopping treatment and the onset of drug interactions may be slow after initiating amiodarone.   [x]  Statins: Increased risk of myopathy. Simvastatin- restrict dose to 20mg  daily. Other statins: counsel patients to report any muscle pain or weakness immediately.  []  Anticoagulants: Amiodarone can increase anticoagulant effect. Consider warfarin dose reduction. Patients should be monitored closely and the dose of anticoagulant altered accordingly, remembering that amiodarone levels take several weeks to stabilize.  []  Antiepileptics: Amiodarone can increase plasma concentration of phenytoin, the dose should be reduced. Note that small changes in phenytoin dose can result in large changes in levels. Monitor patient and counsel on signs of toxicity.  [x]  Beta blockers: increased risk of bradycardia, AV block and myocardial depression. Sotalol - avoid concomitant use.  []   Calcium channel blockers (diltiazem and verapamil): increased risk of bradycardia, AV block and myocardial depression.  []   Cyclosporine: Amiodarone increases levels of cyclosporine. Reduced dose of cyclosporine is recommended.  []  Digoxin dose should be halved when amiodarone is started.  [x]  Diuretics: increased risk of cardiotoxicity if hypokalemia occurs.  []  Oral hypoglycemic agents (glyburide, glipizide, glimepiride): increased risk of hypoglycemia. Patient's glucose levels should be monitored closely when initiating amiodarone therapy.   []  Drugs that prolong the QT interval:  Torsades de pointes risk may be increased with concurrent use - avoid if  possible.  Monitor QTc, also keep magnesium/potassium WNL if concurrent therapy can't be avoided. Marland Kitchen Antibiotics: e.g. fluoroquinolones, erythromycin. . Antiarrhythmics: e.g. quinidine, procainamide, disopyramide, sotalol. . Antipsychotics: e.g. phenothiazines, haloperidol.  . Lithium, tricyclic antidepressants, and methadone. Thank You,  Nani Skillern Crowford  05/07/2016 11:54 PM

## 2016-05-07 NOTE — H&P (Signed)
PULMONARY / CRITICAL CARE MEDICINE   Name: Gregory Dyer MRN: [id number] DOB: 09-24-1946    ADMISSION DATE:  05/05/2016  REFERRING MD:  ER  CHIEF COMPLAINT:  Short of breath  HISTORY OF PRESENT ILLNESS:   70 yo male developed productive cough 3 days prior to admission.  Over 24 hours prior to admission he had sudden onset of dyspnea that became progressively worse.  He was also having diaphoresis.  In ER he had SpO2 in 50's.  He required intubation.  He had BP 196/113 on arrival to ER, and temperature of 95.9.  WBC and Lactic acid elevated.  CXR showed diffuse, patchy ASD concerning for pneumonia.  There was concern for NSTEMI >> Dr. Terrence Dupont was consulted by EDP, and recommend to start heparin gtt.  He was started on antibiotics in ER for community acquired pneumonia.    STUDIES:  CXR 5/12 > B/L opacities. Images reviewed  CULTURES: 5/12 Blood >> 5/12 Sputum >> 5/12 Pneumococcal Ag >>neg 5/12 Legionella Ag >>neg 5/12 Respiratory viral panel >>  Neg 5/12 - urine culture - negatiove     ANTIBIOTICS: 5/12 Rocephin >> 5/12 Zithromax >>  SIGNIFICANT EVENTS: 5/12 Admit, cardiology consulted, start heparin gtt. 5/12 ETT >>   05/06/16 - failed SBT. On ver low dose fent gtt - following commands. RN thinks extubatable. Not on pressoprs. No fever. Making urine. On IV heparin gtt. Trop rising   SUBJECTIVE/OVERNIGHT/INTERVAL HX 05/07/16 -. Abx stopped yesterday . Now with low grade fever. PCT suggests possible localized infection. CVL pulled back yesterday. This AM some ooze arounds CVL - on IV heparin and aggrastat. Plans for cath per cards 05/08/16 or 05/09/16. Failed SBT today but was on fent gtt 43mcg   VITAL SIGNS: BP 98/57 mmHg  Pulse 87  Temp(Src) 99.9 F (37.7 C) (Core (Comment))  Resp 21  Ht 5\' 8"  (1.727 m)  Wt 85.7 kg (188 lb 15 oz)  BMI 28.73 kg/m2  SpO2 95%  HEMODYNAMICS: CVP:  [7 mmHg-10 mmHg] 10 mmHg  VENTILATOR SETTINGS: Vent Mode:  [-] PRVC FiO2 (%):  [40  %-100 %] 100 % Set Rate:  [26 bmp] 26 bmp Vt Set:  [550 mL] 550 mL PEEP:  [5 cmH20-8 cmH20] 8 cmH20 Pressure Support:  [5 cmH20-10 cmH20] 5 cmH20 Plateau Pressure:  [20 cmH20-30 cmH20] 22 cmH20  INTAKE / OUTPUT: I/O last 3 completed shifts: In: 3290.7 [I.V.:1554; NG/GT:1042.7; IV Piggyback:694] Out: 3160 [Urine:3160]  PHYSICAL EXAMINATION: General:  Awake, on vent.  Neuro:  Moves all 4 extremities, No focal defects. Follows commands,. RASS 0 HEENT:  PERRL, No thyromegaly, JVD Cardiovascular:  RRR, No MRG Lungs:  Sync with vent. No wheeze Abdomen:  Obese, soft, distended, + BS Musculoskeletal:  Normal tone and bulk, no edema. Skin:  Intact  LABS:  PULMONARY  Recent Labs Lab 05/05/16 1645 05/05/16 1830 05/06/16 0500  PHART 7.165* 7.239* 7.389  PCO2ART 62.2* 49.0* 35.1  PO2ART 270* 309* 108*  HCO3 21.5 20.2 20.6  TCO2 19.8 18.3 18.0  O2SAT 98.8 99.2 97.9    CBC  Recent Labs Lab 05/06/16 0500 05/06/16 2000 05/07/16 0428  HGB 13.2 14.0 12.9*  HCT 39.0 41.4 37.9*  WBC 8.5 11.1* 7.8  PLT 133* 150 134*    COAGULATION  Recent Labs Lab 05/05/16 1545  INR 1.06    CARDIAC    Recent Labs Lab 05/06/16 0500 05/06/16 1112 05/06/16 1700 05/06/16 2303 05/07/16 0428  TROPONINI 7.25* 6.93* 5.93* 5.03* 5.02*   No results for input(s): PROBNP  in the last 168 hours.   CHEMISTRY  Recent Labs Lab 05/05/16 1545 05/06/16 0500 05/07/16 0428  NA 141 141 142  K 3.5 3.7 3.6  CL 103 109 111  CO2 18* 23 24  GLUCOSE 215* 117* 149*  BUN 16 17 27*  CREATININE 1.27* 0.99 1.21  CALCIUM 9.5 7.6* 7.4*  MG 2.1 1.3* 2.1  PHOS 6.2* 1.9*  --    Estimated Creatinine Clearance: 61.4 mL/min (by C-G formula based on Cr of 1.21).   LIVER  Recent Labs Lab 05/05/16 1545 05/06/16 0500  AST 58* 34  ALT 17 17  ALKPHOS 78 52  BILITOT 1.1 0.8  PROT 8.0 5.3*  ALBUMIN 4.4 2.9*  INR 1.06  --      INFECTIOUS  Recent Labs Lab 05/05/16 1612 05/05/16 1813  05/05/16 1953 05/06/16 0500 05/07/16 0428  LATICACIDVEN 10.30*  --  1.6  --  1.3  PROCALCITON  --  <0.10  --  0.55 0.69     ENDOCRINE CBG (last 3)   Recent Labs  05/06/16 1932 05/07/16 0012 05/07/16 0407  GLUCAP 151* 126* 138*         IMAGING x48h  - image(s) personally visualized  -   highlighted in bold [name] 1 View  05/06/2016  CLINICAL DATA:  Repositioning of central venous catheter. EXAM: PORTABLE CHEST 1 VIEW COMPARISON:  05/06/2016 and 05/05/2016 FINDINGS: Endotracheal tube and enteric tube unchanged. Right IJ central venous catheter has been pulled back as tip is just below the cavoatrial junction. Lungs are adequately inflated with worsening bilateral perihilar opacification compatible with edema. Remainder of the exam is unchanged. IMPRESSION: Worsening interstitial edema. Tubes and lines as described. Note that the right IJ central venous catheter has been pulled back slightly as tip is just below the cavoatrial junction. Electronically Signed   By: Marin Olp M.D.   On: 05/06/2016 12:08   [name] 1 View  05/06/2016  CLINICAL DATA:  Respiratory failure EXAM: PORTABLE CHEST 1 VIEW COMPARISON:  05/05/2016 FINDINGS: The ET tube is in good position. The NG tube terminates below today's film. The tip of the right IJ terminates deep within the right atrium. Recommend withdrawing the line approximately 9 cm into the caval atrial junction. No pneumothorax. Pulmonary edema has improved but persists, a little more focal in the left perihilar region. No other interval changes. IMPRESSION: 1. The right IJ terminates in the right atrium. Recommend withdrawing 9 cm. 2. Improved but persistent pulmonary edema. 3. No other changes. These results will be called to the ordering clinician or representative by the Radiologist Assistant, and communication documented in the PACS or zVision Dashboard. Electronically Signed   By: Dorise Bullion III M.D   On: 05/06/2016 06:56   Dg  Chest Port 1 View  05/05/2016  CLINICAL DATA:  Central line placement. Community acquired pneumonia. Respiratory distress. Shortness of breath and productive cough for 3 days. Hypoxia. EXAM: PORTABLE CHEST 1 VIEW COMPARISON:  05/05/2016 FINDINGS: A new right jugular central venous catheter is seen with tip overlying the inferior aspect of the right atrium, approximately 8 cm below the superior cavoatrial junction. A new nasogastric tube is seen entering the stomach. Endotracheal tube remains in appropriate position. No pneumothorax visualized. Moderate diffuse pulmonary interstitial and airspace disease is seen which is symmetric most likely due to diffuse pulmonary edema. Mild cardiomegaly remains stable. IMPRESSION: New right jugular central venous catheter tip overlies the inferior aspect of the right atrium, approximately 8 cm  below the superior cavoatrial junction. No pneumothorax visualized. Stable moderate diffuse pulmonary edema pattern. Electronically Signed   By: Earle Gell M.D.   On: 05/05/2016 19:29   Dg Chest Portable 1 View  05/05/2016  CLINICAL DATA:  70 year old male with complaints of shortness of breath, and cough for the past 3 days. EXAM: PORTABLE CHEST 1 VIEW COMPARISON:  No priors. FINDINGS: An endotracheal tube is in place with tip 5.7 cm above the carina. Lung volumes appear normal. Diffuse peribronchial cuffing, interstitial prominence in widespread airspace disease throughout the lungs bilaterally. Pulmonary vasculature is obscured. Relative sparing of the lung parenchyma in the periphery of the lungs bilaterally. No definite pleural effusions. Heart size appears upper limits of normal. Upper mediastinal contours are within normal limits. Atherosclerosis in the thoracic aorta. Coronary artery stent incidentally noted. IMPRESSION: 1. Endotracheal tube appears properly located. 2. Diffuse peribronchial cuffing, interstitial prominence and patchy airspace disease throughout the lungs  bilaterally, concerning for severe bronchitis and multilobar bronchopneumonia. Findings are not favored to reflect underlying pulmonary edema, although noncardiogenic edema could be considered. 3. Atherosclerosis. Electronically Signed   By: Vinnie Langton M.D.   On: 05/05/2016 16:17   Dg Abd Portable 1v  05/05/2016  CLINICAL DATA:  NG tube placement EXAM: PORTABLE ABDOMEN - 1 VIEW COMPARISON:  None. FINDINGS: There is NG-tube coiled within proximal stomach with tip in mid stomach. Mild gaseous distended small bowel loops in left abdomen. IMPRESSION: NG tube coiled within proximal stomach with tip in mid stomach. Electronically Signed   By: Lahoma Crocker M.D.   On: 05/05/2016 21:01      DISCUSSION: 70 yo male smoker with acute hypoxic/hypercapnic respiratory failure, productive cough, leukocytosis, lactic acidosis, HTN emergency, b/l pulmonary infiltrates concerning for pneumonia, and NSTEMI.  ASSESSMENT / PLAN:  PULMONARY A: Acute hypoxic/hypercapnic respiratory failure 2nd to b/l pulmonary infiltrates concerning for CAP +/- acute pulmonary edema. Tobacco abuse.   -  Failed SBT due to deats but was on fent gtt. Acute pulm edema v ALI v both P:   Reassess SBT off fent gtt -> extubate if meets criteria F/u CXR, ABG Prn duoneb  CARDIOVASCULAR A:  HTN emergency. NSTEMI likely from demand ischemia.  - troponin going down . On IV heparin and aggrastat gtt. Mild oooze around CVL.  Plan cath 05/08/16 or 05/09/16.Denies chest pain off fent gtt. EF 35% on echo  P:  Heparin gtt and aggrastat gtt per cardiology;monitor oozing around CVL L:asix scheduled but increase dose, Liptor, ASA and beta blocker per cards Cath in 48-72h per Dr Duane Lope   RENAL  Recent Labs Lab 05/05/16 1545 05/06/16 0500 05/07/16 0428  CREATININE 1.27* 0.99 1.21     A:   AKI - improvmed Hypokalemia < 4 P:   Replete  K  GASTROINTESTINAL A:   Nutrition. P:    Tube feeds  HEMATOLOGIC A:    Leukocytosis, polycythemia. P:  F/u CBC  INFECTIOUS A:   Sepsis from community acquired pneumonia.  Abx stopped due to negative PCT and cultures 05/06/16 but 05/07/16 having small bump in PCT and    P:   Start po doxycycline F/U mico [id number] - reordered for 05/08/16  ENDOCRINE A:   Hyperglycemia >> no reported hx of DM. P:   SSI  NEUROLOGIC A:   Acute encephalopathy 2nd to respiratory failure, sepsis/pneumonia. UDS negative 05/05/16  - following commands and calm 05/07/16 but on fent gtt  P:   RASS goal: -1 Dc sedationg gtt F/  FAMILY  -  Updates: Fiance updated at bedside 5/12 and 05/06/16. Pt is estranged from rest of his  family. None at bedside 05/07/2016  - Inter-disciplinary family meet or Palliative Care meeting due by:  5/19    The patient is critically ill with multiple organ systems failure and requires high complexity decision making for assessment and support, frequent evaluation and titration of therapies, application of advanced monitoring technologies and extensive interpretation of multiple databases.   Critical Care Time devoted to patient care services described in this note is  30  Minutes. This time reflects time of care of this signee Dr Brand Males. This critical care time does not reflect procedure time, or teaching time or supervisory time of PA/NP/Med student/Med Resident etc but could involve care discussion time    Dr. Brand Males, M.D., [facility].C.P Pulmonary and Critical Care Medicine Staff Physician [facility] Pager: [phone], If no answer or between  15:00h - 7:00h: call [phone]  05/07/2016 8:07 AM

## 2016-05-07 NOTE — Progress Notes (Signed)
ANTICOAGULATION CONSULT NOTE - Follow Up Consult  Pharmacy Consult for Heparin Indication: chest pain/ACS  Allergies  Allergen Reactions  . No Known Allergies     Patient Measurements: Height: 5\' 8"  (172.7 cm) Weight: 190 lb 4.1 oz (86.3 kg) IBW/kg (Calculated) : 68.4 Heparin Dosing Weight:   Vital Signs: Temp: 99.9 F (37.7 C) (05/14 0308) BP: 96/47 mmHg (05/14 0308) Pulse Rate: 67 (05/14 0308)  Labs:  Recent Labs  05/05/16 1545  05/06/16 0500 05/06/16 1112 05/06/16 1300 05/06/16 1700 05/06/16 2000 05/06/16 2303 05/07/16 0428  HGB 17.5*  --  13.2  --   --   --  14.0  --  12.9*  HCT 52.7*  --  39.0  --   --   --  41.4  --  37.9*  PLT 187  --  133*  --   --   --  150  --  134*  APTT 28  --   --   --   --   --   --   --   --   LABPROT 14.0  --   --   --   --   --   --   --   --   INR 1.06  --   --   --   --   --   --   --   --   HEPARINUNFRC  --   < > 0.13*  --  0.37  --  0.34  --  0.34  CREATININE 1.27*  --  0.99  --   --   --   --   --   --   TROPONINI  --   < > 7.25* 6.93*  --  5.93*  --  5.03*  --   < > = values in this interval not displayed.  Estimated Creatinine Clearance: 75.3 mL/min (by C-G formula based on Cr of 0.99).   Medications:  Infusions:  . fentaNYL infusion INTRAVENOUS 250 mcg/hr (05/07/16 0300)  . heparin 1,250 Units/hr (05/07/16 0300)  . tirofiban 0.15 mcg/kg/min (05/07/16 0300)    Assessment: Patient with 2nd heparin level at goal (0.3-0.5 with 2b-3a inhibitor).  No heparin issues noted.  Goal of Therapy:  Heparin level 0.3-0.5 units/ml Monitor platelets by anticoagulation protocol: Yes   Plan:  Continue heparin drip at current rate Recheck level with AM labs  Nani Skillern Crowford 05/07/2016,5:03 AM

## 2016-05-07 NOTE — Progress Notes (Signed)
Initial Nutrition Assessment  INTERVENTION:   Initiate Vital 1.5 @ 45 ml/hr via OGT.  60 ml Prostat BID.   Tube feeding regimen provides 2020 kcal (97% of needs), 133 grams of protein, and 825 ml of H2O.   RD to continue to monitor  NUTRITION DIAGNOSIS:   Inadequate oral intake related to inability to eat as evidenced by NPO status.  GOAL:   Patient will meet greater than or equal to 90% of their needs  MONITOR:   Vent status, Labs, Weight trends, I & O's, TF tolerance  REASON FOR ASSESSMENT:   Consult, Ventilator Enteral/tube feeding initiation and management  ASSESSMENT:   70 y.o. male With History of tobacco use disorder, hypertension, hyperlipidemia who was admitted to the hospital with 2-3 days onset of cough, also states that he has been having chest pain for the past 2 days prior to the hospital admission.  Patient in room with no family present. Nutrition focused physical exam shows no sign of depletion of muscle mass or body fat. Per weight history, weight has decreased 12 lb since admission 5/12. Has been receiving Lasix, this is most likely fluid loss. Estimated needs calculated using lowest weight documented.  Pt failed extubation attempt. Will adjust TF order to better meet needs if patient is unable to be extubated today.  Patient is currently intubated on ventilator support MV: 14.7 L/min Temp (24hrs), Avg:99.6 F (37.6 C), Min:98.8 F (37.1 C), Max:100.2 F (37.9 C)  Medications: IV Lasix every 8 hrs Lab reviewed: CBGs: 138-148 Mg WNL  Diet Order:  Diet NPO time specified  Skin:  Reviewed, no issues  Last BM:  PTA  Height:   Ht Readings from Last 1 Encounters:  05/05/16 5\' 8"  (1.727 m)    Weight:   Wt Readings from Last 1 Encounters:  05/07/16 188 lb 15 oz (85.7 kg)    Ideal Body Weight:  70 kg  BMI:  Body mass index is 28.73 kg/(m^2).  Estimated Nutritional Needs:   Kcal:  2093  Protein:  120-130g  Fluid:   2L/day  EDUCATION NEEDS:   No education needs identified at this time  Clayton Bibles, MS, RD, LDN Pager: [phone] After Hours Pager: [phone]

## 2016-05-07 NOTE — Progress Notes (Signed)
ANTICOAGULATION CONSULT NOTE - Follow up [place] for Aggrastat Indication: chest pain/ACS  Allergies  Allergen Reactions  . No Known Allergies     Patient Measurements: Height: 5\' 8"  (172.7 cm) Weight: 188 lb 15 oz (85.7 kg) IBW/kg (Calculated) : 68.4 Heparin Dosing Weight: 87 kg  Vital Signs: Temp: 99.3 F (37.4 C) (05/14 1000) BP: 132/84 mmHg (05/14 1000) Pulse Rate: 98 (05/14 1108)  Labs:  Recent Labs  05/05/16 1545  05/06/16 0500  05/06/16 1300 05/06/16 1700 05/06/16 2000 05/06/16 2303 05/07/16 0428  HGB 17.5*  --  13.2  --   --   --  14.0  --  12.9*  HCT 52.7*  --  39.0  --   --   --  41.4  --  37.9*  PLT 187  --  133*  --   --   --  150  --  134*  APTT 28  --   --   --   --   --   --   --   --   LABPROT 14.0  --   --   --   --   --   --   --   --   INR 1.06  --   --   --   --   --   --   --   --   HEPARINUNFRC  --   < > 0.13*  --  0.37  --  0.34  --  0.34  CREATININE 1.27*  --  0.99  --   --   --   --   --  1.21  TROPONINI  --   < > 7.25*  < >  --  5.93*  --  5.03* 5.02*  < > = values in this interval not displayed.  Estimated Creatinine Clearance: 61.4 mL/min (by C-G formula based on Cr of 1.21).   Medical History: Past Medical History  Diagnosis Date  . Hypertension     Assessment: 68 yoM presents 5/12 with shortness of breath with productive cough over 3 days.  Denied chest pain.  Pt hypoxic with respiratory failure and intubated.  Also in hypertensive emergency.  Pharmacy consulted to start heparin infusion for possible ACS.  No history of anticoagulants on file.  Today, 05/07/2016: - Patient with blood oozing from central lines per RN, cards was contacted, and IV heparin was stopped but Aggrastat to continue until possible cardiac cath can be scheduled - CBC OK - plt down but stable. No bleeding reported/documented. - SCr rising and CrCl now 61 ml/min - Cont on home aspirin 81mg  daily, confirmed with cardiology.  Goal of  Therapy:  Heparin level: 0.3-0.5 while on Aggrastat Monitor platelets by anticoagulation protocol: Yes   Plan:  Continue Aggrastat 0.9mcg/kg/min infusion but if SCr continues to rise will need dose reduce to 0.68mcg/kg/min - will monitor closely   Adrian Saran, PharmD, BCPS Pager [phone] 05/07/2016 11:37 AM

## 2016-05-07 NOTE — Progress Notes (Addendum)
[place] Progress Note Patient Name: Gregory Dyer DOB: August 04, 1946 MRN: [id number]   Date of Service  05/07/2016  HPI/Events of Note  Sudden WCT - HR 180s with normal BP and patient answering questions - lasting approx 5 min. Convered to NSR as RNs were getting 12 Lead EKG. 12 Lead EKG showing new changes since earlier this AM  eICU Interventions  1. Stat tropoonin, bmet, mag, phos 2. Stat mag bolus 3. Stat amio bolus and gtt 4. Reestart IV heparin gtt (d/w Dr Terrence Dupont) 5. Dr Terrence Dupont calling Dr Einar Gip for possible intervention tonight 6. Later d/w Dr Einar Gip - to cath lab. Will dc diprivan and sedate with fent gtt     Intervention Category Major Interventions: Arrhythmia - evaluation and management  Mahogani Holohan 05/07/2016, 11:54 PM

## 2016-05-07 NOTE — Progress Notes (Signed)
Pt pulled his peripheral IV out. Pt is anxious and agitated. Propofol dose increased. Will continue to monitor.

## 2016-05-07 NOTE — Progress Notes (Signed)
Subjective:  Remains intubated awake denies any chest pain. Failed trial of extubation earlier today. Lasix dose has been increased. Troponin I's are trending down. Significant oozing from the triple-lumen site.  Objective:  Vital Signs in the last 24 hours: Temp:  [98.8 F (37.1 C)-100.2 F (37.9 C)] 100 F (37.8 C) (05/14 0800) Pulse Rate:  [44-106] 106 (05/14 0800) Resp:  [21-26] 26 (05/14 0800) BP: (90-171)/(47-105) 171/105 mmHg (05/14 0800) SpO2:  [95 %-100 %] 98 % (05/14 0800) FiO2 (%):  [40 %-100 %] 100 % (05/14 0759) Weight:  [85.7 kg (188 lb 15 oz)] 85.7 kg (188 lb 15 oz) (05/14 0500)  Intake/Output from previous day: 05/13 0701 - 05/14 0700 In: 3064.7 [I.V.:1410.7; NG/GT:960; IV Piggyback:694] Out: 2750 [Urine:2750] Intake/Output from this shift:    Physical Exam: Neck: no adenopathy, no carotid bruit, no JVD and supple, symmetrical, trachea midline Lungs: Clear anteriorly decreased breath sounds with occasional rhonchi and rales at bases Heart: regular rate and rhythm, S1, S2 normal and Soft systolic murmur and S3 gallop noted Abdomen: soft, non-tender; bowel sounds normal; no masses,  no organomegaly Extremities: extremities normal, atraumatic, no cyanosis or edema  Lab Results:  Recent Labs  05/06/16 2000 05/07/16 0428  WBC 11.1* 7.8  HGB 14.0 12.9*  PLT 150 134*    Recent Labs  05/06/16 0500 05/07/16 0428  NA 141 142  K 3.7 3.6  CL 109 111  CO2 23 24  GLUCOSE 117* 149*  BUN 17 27*  CREATININE 0.99 1.21    Recent Labs  05/06/16 2303 05/07/16 0428  TROPONINI 5.03* 5.02*   Hepatic Function Panel  Recent Labs  05/06/16 0500  PROT 5.3*  ALBUMIN 2.9*  AST 34  ALT 17  ALKPHOS 52  BILITOT 0.8    Recent Labs  05/06/16 0500  CHOL 150   No results for input(s): PROTIME in the last 72 hours.  Imaging: Imaging results have been reviewed and [name] 1 View  05/06/2016  CLINICAL DATA:  Repositioning of central venous catheter.  EXAM: PORTABLE CHEST 1 VIEW COMPARISON:  05/06/2016 and 05/05/2016 FINDINGS: Endotracheal tube and enteric tube unchanged. Right IJ central venous catheter has been pulled back as tip is just below the cavoatrial junction. Lungs are adequately inflated with worsening bilateral perihilar opacification compatible with edema. Remainder of the exam is unchanged. IMPRESSION: Worsening interstitial edema. Tubes and lines as described. Note that the right IJ central venous catheter has been pulled back slightly as tip is just below the cavoatrial junction. Electronically Signed   By: Marin Olp M.D.   On: 05/06/2016 12:08   [name] 1 View  05/06/2016  CLINICAL DATA:  Respiratory failure EXAM: PORTABLE CHEST 1 VIEW COMPARISON:  05/05/2016 FINDINGS: The ET tube is in good position. The NG tube terminates below today's film. The tip of the right IJ terminates deep within the right atrium. Recommend withdrawing the line approximately 9 cm into the caval atrial junction. No pneumothorax. Pulmonary edema has improved but persists, a little more focal in the left perihilar region. No other interval changes. IMPRESSION: 1. The right IJ terminates in the right atrium. Recommend withdrawing 9 cm. 2. Improved but persistent pulmonary edema. 3. No other changes. These results will be called to the ordering clinician or representative by the Radiologist Assistant, and communication documented in the PACS or zVision Dashboard. Electronically Signed   By: Dorise Bullion III M.D   On: 05/06/2016 06:56   [name] 1 [address]  05/05/2016  CLINICAL DATA:  Central line placement. Community acquired pneumonia. Respiratory distress. Shortness of breath and productive cough for 3 days. Hypoxia. EXAM: PORTABLE CHEST 1 VIEW COMPARISON:  05/05/2016 FINDINGS: A new right jugular central venous catheter is seen with tip overlying the inferior aspect of the right atrium, approximately 8 cm below the superior cavoatrial junction. A new  nasogastric tube is seen entering the stomach. Endotracheal tube remains in appropriate position. No pneumothorax visualized. Moderate diffuse pulmonary interstitial and airspace disease is seen which is symmetric most likely due to diffuse pulmonary edema. Mild cardiomegaly remains stable. IMPRESSION: New right jugular central venous catheter tip overlies the inferior aspect of the right atrium, approximately 8 cm below the superior cavoatrial junction. No pneumothorax visualized. Stable moderate diffuse pulmonary edema pattern. Electronically Signed   By: Earle Gell M.D.   On: 05/05/2016 19:29   Dg Chest Portable 1 View  05/05/2016  CLINICAL DATA:  70 year old male with complaints of shortness of breath, and cough for the past 3 days. EXAM: PORTABLE CHEST 1 VIEW COMPARISON:  No priors. FINDINGS: An endotracheal tube is in place with tip 5.7 cm above the carina. Lung volumes appear normal. Diffuse peribronchial cuffing, interstitial prominence in widespread airspace disease throughout the lungs bilaterally. Pulmonary vasculature is obscured. Relative sparing of the lung parenchyma in the periphery of the lungs bilaterally. No definite pleural effusions. Heart size appears upper limits of normal. Upper mediastinal contours are within normal limits. Atherosclerosis in the thoracic aorta. Coronary artery stent incidentally noted. IMPRESSION: 1. Endotracheal tube appears properly located. 2. Diffuse peribronchial cuffing, interstitial prominence and patchy airspace disease throughout the lungs bilaterally, concerning for severe bronchitis and multilobar bronchopneumonia. Findings are not favored to reflect underlying pulmonary edema, although noncardiogenic edema could be considered. 3. Atherosclerosis. Electronically Signed   By: Vinnie Langton M.D.   On: 05/05/2016 16:17   Dg Abd Portable 1v  05/05/2016  CLINICAL DATA:  NG tube placement EXAM: PORTABLE ABDOMEN - 1 VIEW COMPARISON:  None. FINDINGS: There is  NG-tube coiled within proximal stomach with tip in mid stomach. Mild gaseous distended small bowel loops in left abdomen. IMPRESSION: NG tube coiled within proximal stomach with tip in mid stomach. Electronically Signed   By: Lahoma Crocker M.D.   On: 05/05/2016 21:01    Cardiac Studies:  Assessment/Plan:  Acute non-Q-wave myocardial infarction complicated by acute pulmonary edema Acute respiratory failure secondary to above/possible bilateral pneumonia Status post hypertensive emergency Hyperlipidemia Elevated blood sugar rule out DM Tobacco abuse Plan DC heparin Continue Aggrastat and rest off meds Will schedule for left cardiac catheterization Possible PCI once extubated.  LOS: 2 days    [name] 05/07/2016, 9:07 AM

## 2016-05-08 ENCOUNTER — Inpatient Hospital Stay (HOSPITAL_COMMUNITY): Payer: Medicare Other

## 2016-05-08 ENCOUNTER — Encounter (HOSPITAL_COMMUNITY): Payer: Self-pay | Admitting: Cardiology

## 2016-05-08 ENCOUNTER — Encounter (HOSPITAL_COMMUNITY)
Admission: EM | Disposition: A | Discharge status: Home / Self Care | Payer: Self-pay | Source: Home / Self Care | Attending: Pulmonary Disease

## 2016-05-08 ENCOUNTER — Ambulatory Visit (HOSPITAL_COMMUNITY): Admit: 2016-05-08 | Payer: Self-pay | Admitting: Cardiology

## 2016-05-08 HISTORY — PX: CARDIAC CATHETERIZATION: SHX172

## 2016-05-08 LAB — GLUCOSE, CAPILLARY
GLUCOSE-CAPILLARY: 142 mg/dL — AB (ref 65–99)
GLUCOSE-CAPILLARY: 116 mg/dL — AB (ref 65–99)
GLUCOSE-CAPILLARY: 186 mg/dL — AB (ref 65–99)
Glucose-Capillary: 113 mg/dL — ABNORMAL HIGH (ref 65–99)
Glucose-Capillary: 151 mg/dL — ABNORMAL HIGH (ref 65–99)
Glucose-Capillary: 154 mg/dL — ABNORMAL HIGH (ref 65–99)
Glucose-Capillary: 201 mg/dL — ABNORMAL HIGH (ref 65–99)
Glucose-Capillary: 231 mg/dL — ABNORMAL HIGH (ref 65–99)
Glucose-Capillary: 260 mg/dL — ABNORMAL HIGH (ref 65–99)

## 2016-05-08 LAB — BASIC METABOLIC PANEL
ANION GAP: 11 (ref 5–15)
Anion gap: 9 (ref 5–15)
BUN: 37 mg/dL — ABNORMAL HIGH (ref 6–20)
BUN: 39 mg/dL — AB (ref 6–20)
CO2: 24 mmol/L (ref 22–32)
CO2: 24 mmol/L (ref 22–32)
CREATININE: 1.69 mg/dL — AB (ref 0.61–1.24)
Calcium: 6.9 mg/dL — ABNORMAL LOW (ref 8.9–10.3)
Calcium: 7.7 mg/dL — ABNORMAL LOW (ref 8.9–10.3)
Chloride: 109 mmol/L (ref 101–111)
Chloride: 110 mmol/L (ref 101–111)
Creatinine, Ser: 1.68 mg/dL — ABNORMAL HIGH (ref 0.61–1.24)
GFR calc Af Amer: 46 mL/min — ABNORMAL LOW (ref >60)
GFR calc Af Amer: 46 mL/min — ABNORMAL LOW (ref >60)
GFR, EST NON AFRICAN AMERICAN: 40 mL/min — AB (ref >60)
GFR, EST NON AFRICAN AMERICAN: 40 mL/min — AB (ref >60)
GLUCOSE: 174 mg/dL — AB (ref 65–99)
Glucose, Bld: 193 mg/dL — ABNORMAL HIGH (ref 65–99)
POTASSIUM: 3.7 mmol/L (ref 3.5–5.1)
POTASSIUM: 3.9 mmol/L (ref 3.5–5.1)
SODIUM: 143 mmol/L (ref 135–145)
Sodium: 144 mmol/L (ref 135–145)

## 2016-05-08 LAB — POCT I-STAT 3, ART BLOOD GAS (G3+)
Acid-base deficit: 4 mmol/L — ABNORMAL HIGH (ref 0.0–2.0)
Bicarbonate: 27.1 mEq/L — ABNORMAL HIGH (ref 20.0–24.0)
Bicarbonate: 23.9 mEq/L (ref 20.0–24.0)
O2 SAT: 99 %
O2 SAT: 100 %
PCO2 ART: 78.2 mmHg — AB (ref 35.0–45.0)
PCO2 ART: 34 mmHg — AB (ref 35.0–45.0)
PH ART: 7.148 — AB (ref 7.350–7.450)
PO2 ART: 158 mmHg — AB (ref 80.0–100.0)
TCO2: 29 mmol/L (ref 0–100)
TCO2: 25 mmol/L (ref 0–100)
pH, Arterial: 7.454 — ABNORMAL HIGH (ref 7.350–7.450)
pO2, Arterial: 155 mmHg — ABNORMAL HIGH (ref 80.0–100.0)

## 2016-05-08 LAB — CK TOTAL AND CKMB (NOT AT ARMC)
CK TOTAL: 84 U/L (ref 49–397)
CK, MB: 2.6 ng/mL (ref 0.5–5.0)
RELATIVE INDEX: INVALID (ref 0.0–2.5)

## 2016-05-08 LAB — HEMOGLOBIN A1C
HEMOGLOBIN A1C: 5.5 % (ref 4.8–5.6)
Mean Plasma Glucose: 111 mg/dL

## 2016-05-08 LAB — TROPONIN I: Troponin I: 2.57 ng/mL (ref <0.031)

## 2016-05-08 LAB — PATHOLOGIST SMEAR REVIEW

## 2016-05-08 LAB — POCT ACTIVATED CLOTTING TIME
ACTIVATED CLOTTING TIME: 116 s
ACTIVATED CLOTTING TIME: 353 s
ACTIVATED CLOTTING TIME: 162 s

## 2016-05-08 LAB — PHOSPHORUS
Phosphorus: 2 mg/dL — ABNORMAL LOW (ref 2.5–4.6)
Phosphorus: 3.6 mg/dL (ref 2.5–4.6)

## 2016-05-08 LAB — CBC
HEMATOCRIT: 37.8 % — AB (ref 39.0–52.0)
Hemoglobin: 12.5 g/dL — ABNORMAL LOW (ref 13.0–17.0)
MCH: 32.2 pg (ref 26.0–34.0)
MCHC: 33.1 g/dL (ref 30.0–36.0)
MCV: 97.4 fL (ref 78.0–100.0)
Platelets: 137 10*3/uL — ABNORMAL LOW (ref 150–400)
RBC: 3.88 MIL/uL — AB (ref 4.22–5.81)
RDW: 13.9 % (ref 11.5–15.5)
WBC: 9 10*3/uL (ref 4.0–10.5)

## 2016-05-08 LAB — MAGNESIUM
MAGNESIUM: 1.8 mg/dL (ref 1.7–2.4)
Magnesium: 2 mg/dL (ref 1.7–2.4)

## 2016-05-08 SURGERY — LEFT HEART CATH AND CORONARY ANGIOGRAPHY
Anesthesia: LOCAL

## 2016-05-08 MED ORDER — LIDOCAINE IN D5W 4-5 MG/ML-% IV SOLN
2.0000 mg/min | INTRAVENOUS | Status: DC
  Administered 2016-05-08: 2 mg/min via INTRAVENOUS
  Filled 2016-05-08: qty 500

## 2016-05-08 MED ORDER — DEXTROSE 5 % IV SOLN
2.0000 ug/min | INTRAVENOUS | Status: DC
  Filled 2016-05-08: qty 4

## 2016-05-08 MED ORDER — BIVALIRUDIN BOLUS VIA INFUSION - CUPID
INTRAVENOUS | Status: DC | PRN
  Administered 2016-05-08: 64.275 mg via INTRAVENOUS

## 2016-05-08 MED ORDER — NITROGLYCERIN 1 MG/10 ML FOR IR/CATH LAB
INTRA_ARTERIAL | Status: AC
  Filled 2016-05-08: qty 10

## 2016-05-08 MED ORDER — SODIUM CHLORIDE 0.9 % IV SOLN
10.0000 ug/h | INTRAVENOUS | Status: DC
  Administered 2016-05-08 (×2): 200 ug/h via INTRAVENOUS
  Administered 2016-05-08: 100 ug/h via INTRAVENOUS
  Administered 2016-05-08: 50 ug/h via INTRAVENOUS
  Administered 2016-05-08: 100 ug/h via INTRAVENOUS
  Filled 2016-05-08 (×4): qty 50

## 2016-05-08 MED ORDER — BIVALIRUDIN 250 MG IV SOLR
INTRAVENOUS | Status: AC
  Filled 2016-05-08: qty 250

## 2016-05-08 MED ORDER — HEPARIN (PORCINE) IN NACL 100-0.45 UNIT/ML-% IJ SOLN
1250.0000 [IU]/h | INTRAMUSCULAR | Status: DC
  Filled 2016-05-08: qty 250

## 2016-05-08 MED ORDER — SODIUM BICARBONATE 8.4 % IV SOLN
100.0000 meq | Freq: Once | INTRAVENOUS | Status: AC
  Filled 2016-05-08: qty 50

## 2016-05-08 MED ORDER — SODIUM BICARBONATE 8.4 % IV SOLN: 100.0000 meq | Freq: Once | INTRAVENOUS | Status: DC

## 2016-05-08 MED ORDER — SODIUM CHLORIDE 0.9% FLUSH
10.0000 mL | INTRAVENOUS | Status: DC | PRN
  Administered 2016-05-11: 30 mL
  Filled 2016-05-08: qty 40

## 2016-05-08 MED ORDER — AMIODARONE HCL IN DEXTROSE 360-4.14 MG/200ML-% IV SOLN
INTRAVENOUS | Status: AC
  Filled 2016-05-08: qty 200

## 2016-05-08 MED ORDER — LIDOCAINE IN D5W 4-5 MG/ML-% IV SOLN
2.0000 mg/min | INTRAVENOUS | Status: DC
  Filled 2016-05-08: qty 500

## 2016-05-08 MED ORDER — ACETAMINOPHEN 325 MG PO TABS
650.0000 mg | ORAL_TABLET | ORAL | Status: DC | PRN
  Administered 2016-05-10 (×2): 650 mg via ORAL
  Filled 2016-05-08 (×2): qty 2

## 2016-05-08 MED ORDER — IOPAMIDOL (ISOVUE-370) INJECTION 76%
INTRAVENOUS | Status: DC | PRN
  Administered 2016-05-08: 90 mL via INTRAVENOUS

## 2016-05-08 MED ORDER — ATROPINE SULFATE 1 MG/10ML IJ SOSY
PREFILLED_SYRINGE | INTRAMUSCULAR | Status: AC
  Filled 2016-05-08: qty 10

## 2016-05-08 MED ORDER — VANCOMYCIN HCL IN DEXTROSE 750-5 MG/150ML-% IV SOLN
750.0000 mg | Freq: Two times a day (BID) | INTRAVENOUS | Status: DC
  Administered 2016-05-08 – 2016-05-10 (×5): 750 mg via INTRAVENOUS
  Filled 2016-05-08 (×6): qty 150

## 2016-05-08 MED ORDER — PIPERACILLIN-TAZOBACTAM 3.375 G IVPB
3.3750 g | Freq: Three times a day (TID) | INTRAVENOUS | Status: DC
  Administered 2016-05-08 – 2016-05-11 (×10): 3.375 g via INTRAVENOUS
  Filled 2016-05-08 (×13): qty 50

## 2016-05-08 MED ORDER — SODIUM CHLORIDE 0.9 % IV SOLN
1.0000 mg/h | INTRAVENOUS | Status: DC
  Administered 2016-05-08 (×2): 1 mg/h via INTRAVENOUS
  Filled 2016-05-08 (×2): qty 10

## 2016-05-08 MED ORDER — SODIUM CHLORIDE 0.9% FLUSH
10.0000 mL | Freq: Two times a day (BID) | INTRAVENOUS | Status: DC
  Administered 2016-05-08 – 2016-05-09 (×2): 10 mL

## 2016-05-08 MED ORDER — BIVALIRUDIN 250 MG IV SOLR
250.0000 mg | INTRAVENOUS | Status: DC | PRN
  Administered 2016-05-08: 1.75 mg/kg/h via INTRAVENOUS

## 2016-05-08 MED ORDER — HEPARIN (PORCINE) IN NACL 2-0.9 UNIT/ML-% IJ SOLN
INTRAMUSCULAR | Status: AC
  Filled 2016-05-08: qty 1000

## 2016-05-08 MED ORDER — LIDOCAINE HCL (PF) 1 % IJ SOLN
INTRAMUSCULAR | Status: AC
  Filled 2016-05-08: qty 30

## 2016-05-08 MED ORDER — HEPARIN SODIUM (PORCINE) 5000 UNIT/ML IJ SOLN
5000.0000 [IU] | Freq: Three times a day (TID) | INTRAMUSCULAR | Status: DC
  Administered 2016-05-08 – 2016-05-12 (×13): 5000 [IU] via SUBCUTANEOUS
  Filled 2016-05-08 (×13): qty 1

## 2016-05-08 MED ORDER — ONDANSETRON HCL 4 MG/2ML IJ SOLN: 4.0000 mg | Freq: Four times a day (QID) | INTRAMUSCULAR | Status: DC | PRN

## 2016-05-08 MED ORDER — IOPAMIDOL (ISOVUE-370) INJECTION 76%
INTRAVENOUS | Status: AC
  Filled 2016-05-08: qty 125

## 2016-05-08 MED ORDER — CLOPIDOGREL BISULFATE 75 MG PO TABS
75.0000 mg | ORAL_TABLET | Freq: Every day | ORAL | Status: DC
  Administered 2016-05-08 – 2016-05-12 (×5): 75 mg via ORAL
  Filled 2016-05-08 (×5): qty 1

## 2016-05-08 MED ORDER — LIDOCAINE HCL (PF) 1 % IJ SOLN
INTRAMUSCULAR | Status: DC | PRN
  Administered 2016-05-08: 20 mL

## 2016-05-08 MED ORDER — SODIUM CHLORIDE 0.9 % IV SOLN
INTRAVENOUS | Status: AC
  Administered 2016-05-08: 04:00:00 via INTRAVENOUS

## 2016-05-08 MED ORDER — PANTOPRAZOLE SODIUM 40 MG IV SOLR
40.0000 mg | Freq: Every day | INTRAVENOUS | Status: DC
  Administered 2016-05-08 – 2016-05-10 (×3): 40 mg via INTRAVENOUS
  Filled 2016-05-08 (×4): qty 40

## 2016-05-08 MED ORDER — ENOXAPARIN SODIUM 40 MG/0.4ML ~~LOC~~ SOLN: 40.0000 mg | SUBCUTANEOUS | Status: DC

## 2016-05-08 MED ORDER — SODIUM CHLORIDE 0.9% FLUSH
3.0000 mL | Freq: Two times a day (BID) | INTRAVENOUS | Status: DC
  Administered 2016-05-08 (×2): 3 mL via INTRAVENOUS

## 2016-05-08 MED ORDER — HEPARIN (PORCINE) IN NACL 2-0.9 UNIT/ML-% IJ SOLN
INTRAMUSCULAR | Status: DC | PRN
  Administered 2016-05-08: 1500 mL

## 2016-05-08 MED ORDER — SODIUM CHLORIDE 0.9% FLUSH: 3.0000 mL | INTRAVENOUS | Status: DC | PRN

## 2016-05-08 MED ORDER — SODIUM CHLORIDE 0.9 % IV SOLN
250.0000 mL | INTRAVENOUS | Status: DC | PRN
  Administered 2016-05-08: 250 mL via INTRAVENOUS

## 2016-05-08 SURGICAL SUPPLY — 18 items
BALLN EUPHORA RX 2.0X12 (BALLOONS) ×2
BALLOON EUPHORA RX 2.0X12 (BALLOONS) ×1 IMPLANT
CATH INFINITI 5FR JL4 (CATHETERS) ×2 IMPLANT
CATH INFINITI 5FR MPB2 (CATHETERS) ×2 IMPLANT
CATH INFINITI JR4 5F (CATHETERS) ×2 IMPLANT
CATH VISTA GUIDE 6FR JR4 (CATHETERS) ×2 IMPLANT
CATH VISTA GUIDE 6FR XB3.5 (CATHETERS) ×2 IMPLANT
ELECT DEFIB PAD ADLT CADENCE (PAD) ×2 IMPLANT
GLIDESHEATH SLEND A-KIT 6F 20G (SHEATH) IMPLANT
KIT HEART LEFT (KITS) ×2 IMPLANT
PACK CARDIAC CATHETERIZATION (CUSTOM PROCEDURE TRAY) ×2 IMPLANT
SHEATH PINNACLE 6F 10CM (SHEATH) ×2 IMPLANT
TRANSDUCER W/STOPCOCK (MISCELLANEOUS) ×2 IMPLANT
TUBING CIL FLEX 10 FLL-RA (TUBING) ×2 IMPLANT
WIRE COUGAR XT STRL 190CM (WIRE) ×4 IMPLANT
WIRE EMERALD 3MM-J .035X150CM (WIRE) ×2 IMPLANT
WIRE HI TORQ VERSACORE-J 145CM (WIRE) ×2 IMPLANT
WIRE SAFE-T 1.5MM-J .035X260CM (WIRE) IMPLANT

## 2016-05-08 NOTE — Progress Notes (Signed)
Subjective:  Events of last night and early morning noted patient had sustained VT and new EKG findings suggestive of inferoposterior wall injury subsequently underwent cardiac catheterization by Dr. Einar Gip  and attempted PCI to left circumflex and RCA felt to be chronically occluded vessels. Patient presently intubated sedated. No further episodes of V. tach.  Objective:  Vital Signs in the last 24 hours: Temp:  [98.1 F (36.7 C)-100.6 F (38.1 C)] 98.1 F (36.7 C) (05/15 0400) Pulse Rate:  [66-196] 78 (05/15 0600) Resp:  [11-35] 35 (05/15 0600) BP: (57-181)/(36-130) 99/60 mmHg (05/15 0600) SpO2:  [91 %-100 %] 100 % (05/15 0600) FiO2 (%):  [40 %-100 %] 60 % (05/15 0744) Weight:  [86.1 kg (189 lb 13.1 oz)] 86.1 kg (189 lb 13.1 oz) (05/15 0427)  Intake/Output from previous day: 05/14 0701 - 05/15 0700 In: 2381.8 [I.V.:1591.8; NG/GT:790] Out: 2775 [Urine:2775] Intake/Output from this shift:    Physical Exam: Neck: no adenopathy, no carotid bruit, no JVD and supple, symmetrical, trachea midline Lungs: Decreased breath sound at bases with occasional rhonchi and rales Heart: regular rate and rhythm, S1, S2 normal and Soft systolic murmur and S3 gallop noted Abdomen: soft, non-tender; bowel sounds normal; no masses,  no organomegaly Extremities: extremities normal, atraumatic, no cyanosis or edema and Right groin stable  Lab Results:  Recent Labs  05/07/16 0428 05/08/16 0420  WBC 7.8 9.0  HGB 12.9* 12.5*  PLT 134* 137*    Recent Labs  05/07/16 2340 05/08/16 0420  NA 143 144  K 3.7 3.9  CL 110 109  CO2 24 24  GLUCOSE 174* 193*  BUN 39* 37*  CREATININE 1.69* 1.68*    Recent Labs  05/07/16 0428 05/07/16 2340  TROPONINI 5.02* 2.57*   Hepatic Function Panel  Recent Labs  05/06/16 0500  PROT 5.3*  ALBUMIN 2.9*  AST 34  ALT 17  ALKPHOS 52  BILITOT 0.8    Recent Labs  05/06/16 0500  CHOL 150   No results for input(s): PROTIME in the last 72  hours.  Imaging: Imaging results have been reviewed and [name] 1 View  05/08/2016  CLINICAL DATA:  Endotracheal tube present EXAM: PORTABLE CHEST 1 VIEW COMPARISON:  05/07/2016; 05/06/2016; 05/05/2016 FINDINGS: Grossly unchanged enlarged cardiac silhouette and mediastinal contours. Stable positioning of support apparatus. No pneumothorax. The pulmonary vasculature remains indistinct with grossly unchanged perihilar airspace opacities with potential air bronchograms noted about the bilateral pulmonary hila, left greater than right. No new focal airspace opacities. No definite pleural effusion, though note, the right costophrenic angle is excluded from view. Unchanged bones. IMPRESSION: 1.  Stable positioning of support apparatus.  No pneumothorax. 2. Similar findings most suggestive of alveolar pulmonary edema. Electronically Signed   By: Sandi Mariscal M.D.   On: 05/08/2016 07:17   [name] 1 View  05/07/2016  CLINICAL DATA:  Shortness of Breath EXAM: PORTABLE CHEST 1 VIEW COMPARISON:  05/06/2016 FINDINGS: Cardiomediastinal silhouette is stable. Stable endotracheal and NG tube position. Right IJ central line is unchanged in position. Again noted bilateral perihilar airspace opacification and mild interstitial prominence bilaterally consistent with pulmonary edema. No segmental infiltrates. IMPRESSION: Stable support apparatus. Bilateral pulmonary edema without change in aeration. Electronically Signed   By: Lahoma Crocker M.D.   On: 05/07/2016 10:48   [name] 1 View  05/06/2016  CLINICAL DATA:  Repositioning of central venous catheter. EXAM: PORTABLE CHEST 1 VIEW COMPARISON:  05/06/2016 and 05/05/2016 FINDINGS: Endotracheal tube and enteric tube unchanged.  Right IJ central venous catheter has been pulled back as tip is just below the cavoatrial junction. Lungs are adequately inflated with worsening bilateral perihilar opacification compatible with edema. Remainder of the exam is unchanged.  IMPRESSION: Worsening interstitial edema. Tubes and lines as described. Note that the right IJ central venous catheter has been pulled back slightly as tip is just below the cavoatrial junction. Electronically Signed   By: Marin Olp M.D.   On: 05/06/2016 12:08    Cardiac Studies:  Assessment/Plan:  Status post Acute small non-Q-wave myocardial infarction complicated by acute pulmonary edema Status post sustained VT  Inferoposterior wall acute injury pattern in the setting of marked sinus tachycardia/VT status post left cath and attempted PCI to left circumflex and RCA. Ischemic dilated cardiomyopathy Multivessel CAD Acute respiratory failure secondary to above/possible bilateral pneumonia Status post hypertensive emergency Tobacco abuse Acute kidney injury secondary to hypotension/contrast Plan DC lidocaine Hold metoprolol for now in view of decompensated CHF Check serial enzymes and EKG Monitor renal function Weaning as per CCM May need viability studies once extubated and stable to see if candidate for CABG. Prognosis guarded   LOS: 3 days    [name] 05/08/2016, 7:56 AM

## 2016-05-08 NOTE — Progress Notes (Addendum)
[facility] Progress Note Patient Name: Antohny Samuels DOB: Dec 26, 1945 MRN: [id number]   Date of Service  05/08/2016  HPI/Events of Note  D/w Dr Einar Gip - lot of chronic blockages and LVEDP 45 - very pporor prognosis. Did respond on fent gtt - so will hold off cooling   Recent Labs Lab 05/05/16 1645 05/05/16 1830 05/06/16 0500 05/08/16 0227  PHART 7.165* 7.239* 7.389 7.148*  PCO2ART 62.2* 49.0* 35.1 78.2*  PO2ART 270* 309* 108* 155.0*  HCO3 21.5 20.2 20.6 27.1*  TCO2 19.8 18.3 18.0 29  O2SAT 98.8 99.2 97.9 99.0   Very acidotic post cath per RT with abg 3:29 AM similar to 1 hour ago  All fevrr rising   eICU Interventions  Increase Ve by increasing RR Add vesed sedation Change abx to vanc/zosyn     Intervention Category Major Interventions: Other:  Jynesis Nakamura 05/08/2016, 3:24 AM

## 2016-05-08 NOTE — Progress Notes (Addendum)
Per S/O Deborah Jones statement pt "fell from 140ft ladder, hit head and has severe left side weakness, hospitalized for 6 months". Upon assessment this RN does not notice any traumatic brain surgery markings and pt is purposefully moving LUE and LLE. S/O stated, "He doesn't have anyone but me", "just met in December and had plans to get married soon". Deborah is a bit distraught over the recent hospitalization d/t her late husband was in the same CICU room and feels that Mr. Geiger has been lying to her. Pt restless while s/o at bedside, fentanyl bolus given. S/O educated on comfort/rest of patient condition regarding sedatives and ventilator. Encouraged s/o to call for updates and questions. Emotional support given to s/o.   Pt able to follow command with L side, raise arm, grip, wiggle toes; but unable to move right extremities. Pt opens eyes to command.  

## 2016-05-08 NOTE — Progress Notes (Signed)
eLink Physician-Brief Progress Note Patient Name: Otoniel Llanas DOB: 1946-11-28 MRN: [id number]   MRN: [id number]   Date of Service  05/08/2016  HPI/Events of Note  Back in [facility] - d/w Dr Einar Gip (update lasted > 53min)  eICU Interventions  To cath lab stat Start lidocaine infusion stat in addition to above 2mg /ml Keep well sedated - start fent gtt      Intervention Category            Intervention Category Major Interventions: Arrhythmia - evaluation and management  Raidon Swanner 05/08/2016, 12:19 AM

## 2016-05-08 NOTE — Progress Notes (Signed)
PULMONARY / CRITICAL CARE MEDICINE   Name: Gregory Dyer MRN: [id number] DOB: [birth year]    ADMISSION DATE:  05/05/2016  REFERRING MD:  ER  CHIEF COMPLAINT:  Short of breath  HISTORY OF PRESENT ILLNESS:   70 yo male developed productive cough 3 days prior to admission.  Over 24 hours prior to admission he had sudden onset of dyspnea that became progressively worse.  He was also having diaphoresis.  In ER he had SpO2 in 50's.  He required intubation.  He had BP 196/113 on arrival to ER, and temperature of 95.9.  WBC and Lactic acid elevated.  CXR showed diffuse, patchy ASD concerning for pneumonia.  There was concern for NSTEMI >> Dr. Terrence Dupont was consulted by EDP, and recommend to start heparin gtt.  He was started on antibiotics in ER for community acquired pneumonia.   STUDIES:  CXR 5/12 > B/L opacities. Images reviewed  CULTURES: 5/12 Blood >> 5/12 Sputum >> 5/12 Pneumococcal Ag >>neg 5/12 Legionella Ag >>neg 5/12 Respiratory viral panel >>  Neg 5/12 - urine culture - negatiove  ANTIBIOTICS: 5/12 Rocephin >> 5/12 Zithromax >> Vanco  5/15 >> Zosyn 5/14 >>  SIGNIFICANT EVENTS: 5/12 Admit, cardiology consulted, start heparin gtt. 5/12 ETT >>  05/06/16 - failed SBT.  5/14-  Cardiac arrest with VT, urgent cath.   SUBJECTIVE/OVERNIGHT/INTERVAL HX Had a cardiac arrest with sustained ventricular tachycardia. Taken urgently to cath which showed chronic disease with no acute changes. Elevated LVEDP at 45. Now acidotic.   VITAL SIGNS: BP 99/60 mmHg  Pulse 78  Temp(Src) 98.1 F (36.7 C) (Oral)  Resp 35  Ht 5\' 8"  (1.727 m)  Wt 189 lb 13.1 oz (86.1 kg)  BMI 28.87 kg/m2  SpO2 100%  HEMODYNAMICS: CVP:  [7 mmHg-16 mmHg] 9 mmHg  VENTILATOR SETTINGS: Vent Mode:  [-] PRVC FiO2 (%):  [40 %-100 %] 100 % Set Rate:  [26 bmp-35 bmp] 35 bmp Vt Set:  [550 mL] 550 mL PEEP:  [5 cmH20-8 cmH20] 5 cmH20 Pressure Support:  [5 cmH20-10 cmH20] 5 cmH20 Plateau Pressure:  [20 cmH20-27  cmH20] 27 cmH20  INTAKE / OUTPUT: I/O last 3 completed shifts: In: 3985.6 [I.V.:1811.6; NG/GT:1480; IV Piggyback:694] Out: 4300 [Urine:4300]  PHYSICAL EXAMINATION: General: Sedated, on vent.  Neuro:  No focal defects. Follows commands,. RASS 0 HEENT:  PERRL, No thyromegaly, JVD Cardiovascular:  RRR, No MRG Lungs:  Clear, no wheeze, crackles Abdomen:  Obese, soft, distended, + BS Musculoskeletal:  Normal tone and bulk, no edema. Skin:  Intact  LABS:  PULMONARY  Recent Labs Lab 05/05/16 1645 05/05/16 1830 05/06/16 0500 05/08/16 0227 05/08/16 0434  PHART 7.165* 7.239* 7.389 7.148* 7.454*  PCO2ART 62.2* 49.0* 35.1 78.2* 34.0*  PO2ART 270* 309* 108* 155.0* 158.0*  HCO3 21.5 20.2 20.6 27.1* 23.9  TCO2 19.8 18.3 18.0 29 25  O2SAT 98.8 99.2 97.9 99.0 100.0    CBC  Recent Labs Lab 05/06/16 2000 05/07/16 0428 05/08/16 0420  HGB 14.0 12.9* 12.5*  HCT 41.4 37.9* 37.8*  WBC 11.1* 7.8 9.0  PLT 150 134* 137*    COAGULATION  Recent Labs Lab 05/05/16 1545  INR 1.06    CARDIAC    Recent Labs Lab 05/06/16 1112 05/06/16 1700 05/06/16 2303 05/07/16 0428 05/07/16 2340  TROPONINI 6.93* 5.93* 5.03* 5.02* 2.57*   No results for input(s): PROBNP in the last 168 hours.   CHEMISTRY  Recent Labs Lab 05/05/16 1545 05/06/16 0500 05/07/16 0428 05/07/16 2340 05/08/16 0420  NA 141 141 142  143 144  K 3.5 3.7 3.6 3.7 3.9  CL 103 109 111 110 109  CO2 18* 23 24 24 24   GLUCOSE 215* 117* 149* 174* 193*  BUN 16 17 27* 39* 37*  CREATININE 1.27* 0.99 1.21 1.69* 1.68*  CALCIUM 9.5 7.6* 7.4* 7.7* 6.9*  MG 2.1 1.3* 2.1 1.8 2.0  PHOS 6.2* 1.9*  --  2.0* 3.6   Estimated Creatinine Clearance: 44.3 mL/min (by C-G formula based on Cr of 1.68).   LIVER  Recent Labs Lab 05/05/16 1545 05/06/16 0500  AST 58* 34  ALT 17 17  ALKPHOS 78 52  BILITOT 1.1 0.8  PROT 8.0 5.3*  ALBUMIN 4.4 2.9*  INR 1.06  --      INFECTIOUS  Recent Labs Lab 05/05/16 1612  05/05/16 1813 05/05/16 1953 05/06/16 0500 05/07/16 0428  LATICACIDVEN 10.30*  --  1.6  --  1.3  PROCALCITON  --  <0.10  --  0.55 0.69     ENDOCRINE CBG (last 3)   Recent Labs  05/07/16 1624 05/07/16 1916 05/07/16 2346  GLUCAP 188* 106* 154*   IMAGING x48h  - image(s) personally visualized  -   highlighted in bold [name] 1 View  05/07/2016  CLINICAL DATA:  Shortness of Breath EXAM: PORTABLE CHEST 1 VIEW COMPARISON:  05/06/2016 FINDINGS: Cardiomediastinal silhouette is stable. Stable endotracheal and NG tube position. Right IJ central line is unchanged in position. Again noted bilateral perihilar airspace opacification and mild interstitial prominence bilaterally consistent with pulmonary edema. No segmental infiltrates. IMPRESSION: Stable support apparatus. Bilateral pulmonary edema without change in aeration. Electronically Signed   By: Lahoma Crocker M.D.   On: 05/07/2016 10:48   [name] 1 View  05/06/2016  CLINICAL DATA:  Repositioning of central venous catheter. EXAM: PORTABLE CHEST 1 VIEW COMPARISON:  05/06/2016 and 05/05/2016 FINDINGS: Endotracheal tube and enteric tube unchanged. Right IJ central venous catheter has been pulled back as tip is just below the cavoatrial junction. Lungs are adequately inflated with worsening bilateral perihilar opacification compatible with edema. Remainder of the exam is unchanged. IMPRESSION: Worsening interstitial edema. Tubes and lines as described. Note that the right IJ central venous catheter has been pulled back slightly as tip is just below the cavoatrial junction. Electronically Signed   By: Marin Olp M.D.   On: 05/06/2016 12:08   DISCUSSION: 70 yo male smoker with acute hypoxic/hypercapnic respiratory failure, productive cough, leukocytosis, lactic acidosis, HTN emergency, b/l pulmonary infiltrates concerning for pneumonia, and NSTEMI. Cath reveals severe decompensated heart failure.  ASSESSMENT /  PLAN:  PULMONARY A: Acute hypoxic/hypercapnic respiratory failure 2nd to b/l pulmonary infiltrates concerning for CAP +/- acute pulmonary edema. Tobacco abuse. P:   Full vent support for now. Start wake up and breath assessments F/u CXR, ABG  CARDIOVASCULAR A:  HTN emergency. NSTEMI likely from demand ischemia. S/P cardiac cath after VT. EF appeared to be 15-20% with global hypokinesis P:  Amio and lidocaine as per cardiology Lasix scheduled. May be limited due to hypotension. Liptor, ASA Holding beta blockers  RENAL A:   AKI - improvmed P:   Monitor urine output and cr  GASTROINTESTINAL A:   Nutrition. P:   Tube feeds  HEMATOLOGIC A:   Leukocytosis, polycythemia > improved P:  F/u CBC  INFECTIOUS A:   Sepsis from community acquired pneumonia.   P:   Restarted on vanco, zosyn for fevers Follow cultures, HIV test  ENDOCRINE A:   Hyperglycemia >> no reported hx of  DM. P:   SSI  NEUROLOGIC A:   Acute encephalopathy 2nd to respiratory failure, sepsis/pneumonia. UDS negative 05/05/16 P:   RASS goal: -1  FAMILY  - Updates: Fiance updated at bedside 5/12 and 05/06/16. Pt is estranged from rest of his  family. None at bedside 05/08/2016 - Inter-disciplinary family meet or Palliative Care meeting due by:  5/19  Critical care time- 35 mins.  Marshell Garfinkel MD [facility] Pager [phone] If no answer or after 3pm call: [phone] 05/08/2016, 7:03 AM

## 2016-05-08 NOTE — Progress Notes (Signed)
D/c'd right femoral sheath with two RNs at bedside. Pressure held for 45 minutes. VS stable throughout. Site level 0. Pressure dressing applied.

## 2016-05-08 NOTE — Progress Notes (Signed)
Pharmacy Antibiotic Note  Gregory Dyer is a 70 y.o. male admitted on 05/05/2016 with cardiac issues.  Pharmacy has been consulted for Vancomycin/Zosyn dosing for possible pneumonia. WBC WNL. Likely to have acute renal failure s/p arrest. Acidotic on ABG.   Plan: -Vancomycin 750 mg IV q12h -Zosyn 3.375G IV q8h to be infused over 4 hours -Trend WBC, temp, renal function  -Drug levels as indicated   Height: 5\' 8"  (172.7 cm) Weight: 188 lb 15 oz (85.7 kg) IBW/kg (Calculated) : 68.4  Temp (24hrs), Avg:100 F (37.8 C), Min:99.1 F (37.3 C), Max:100.6 F (38.1 C)   Recent Labs Lab 05/05/16 1545 05/05/16 1612 05/05/16 1953 05/06/16 0500 05/06/16 2000 05/07/16 0428 05/07/16 2340  WBC 20.6*  --   --  8.5 11.1* 7.8  --   CREATININE 1.27*  --   --  0.99  --  1.21 1.69*  LATICACIDVEN  --  10.30* 1.6  --   --  1.3  --     Estimated Creatinine Clearance: 43.9 mL/min (by C-G formula based on Cr of 1.69).    Allergies  Allergen Reactions  . No Known Allergies     [place], [place] 05/08/2016 3:31 AM

## 2016-05-08 NOTE — Progress Notes (Signed)
[facility] Progress Note Patient Name: Gregory Dyer DOB: [birth year] MRN: [id number]   Date of Service  05/08/2016  HPI/Events of Note  Status update:  Care link showed up and he lost pusle 2 min cpr with 1 x epi and 1 x lidocaine .   eICU Interventions  Lidocaine gtt - just started 2 amp bic bolus Mag bolus repeat Levophed gtt for trip - if needed  If he makes to cath lab at [place] -> then tx to [place]     Intervention Category Major Interventions: Code management / supervision  Stefanee Mckell 05/08/2016, 12:39 AM

## 2016-05-08 NOTE — Progress Notes (Signed)
ANTICOAGULATION CONSULT NOTE - Initial Consult  Pharmacy Consult for Heparin Indication: chest pain/ACS  Allergies  Allergen Reactions  . No Known Allergies     Patient Measurements: Height: 5\' 8"  (172.7 cm) Weight: 188 lb 15 oz (85.7 kg) IBW/kg (Calculated) : 68.4 Heparin Dosing Weight:   Vital Signs: Temp: 100 F (37.8 C) (05/14 2212) BP: 118/51 mmHg (05/14 2312) Pulse Rate: 107 (05/14 2312)  Labs:  Recent Labs  05/05/16 1545  05/06/16 0500  05/06/16 1300 05/06/16 1700 05/06/16 2000 05/06/16 2303 05/07/16 0428  HGB 17.5*  --  13.2  --   --   --  14.0  --  12.9*  HCT 52.7*  --  39.0  --   --   --  41.4  --  37.9*  PLT 187  --  133*  --   --   --  150  --  134*  APTT 28  --   --   --   --   --   --   --   --   LABPROT 14.0  --   --   --   --   --   --   --   --   INR 1.06  --   --   --   --   --   --   --   --   HEPARINUNFRC  --   < > 0.13*  --  0.37  --  0.34  --  0.34  CREATININE 1.27*  --  0.99  --   --   --   --   --  1.21  TROPONINI  --   < > 7.25*  < >  --  5.93*  --  5.03* 5.02*  < > = values in this interval not displayed.  Estimated Creatinine Clearance: 61.4 mL/min (by C-G formula based on Cr of 1.21).   Medical History: Past Medical History  Diagnosis Date  . Hypertension     Medications:  Infusions:  . amiodarone 60 mg/hr (05/07/16 2352)   Followed by  . amiodarone    . feeding supplement (VITAL 1.5 CAL) 1,000 mL (05/07/16 2000)  . fentaNYL infusion INTRAVENOUS    . heparin    . tirofiban 0.15 mcg/kg/min (05/07/16 2000)    Assessment: Patient known to pharmacy for prior dosing of heparin for ACS.  Patient still has tirofiban running. Goal of Therapy:  Heparin level 0.3-0.5 units/ml Monitor platelets by anticoagulation protocol: Yes   Plan:  Restart heparin at 1250 units/hr Check level 8hr after infusion starts or after procedure.  Tyler Deis, Shea Stakes Crowford 05/08/2016,12:14 AM

## 2016-05-08 NOTE — Progress Notes (Signed)
Pt starting in/out of V-tach around 2310. The first V-tach episode lasted less than 2 min, then pt spontaneously converted into normal sinus rhythm; pt was alert and following command.  The second episode lasted about 5 minutes. EKG was obtained that showed Acute STEMI. CCM was made aware, and CCM called cardiology.  Multiple episodes of V-tach (lasting at least a couple of minutes) and spontaneous conversion to SR/ST occurred with pt maintaining alertness the entire time.  [facility] was called by MD/Cardiologist to take pt to the Cath lab at [facility].  Multiple meds were given per order (See MAR). Around 00:30, right before [facility] can take pt to the cath lab, pt had a pulseless V-Tach, become unsreponsive and a code blue was called.  Around 0100: pt become alert again, nodding appropriately to questions, and was following command appropriately.  Pt was taken to the Cath lab by [facility].

## 2016-05-09 ENCOUNTER — Inpatient Hospital Stay (HOSPITAL_COMMUNITY): Payer: Medicare Other

## 2016-05-09 LAB — BLOOD GAS, ARTERIAL
ACID-BASE EXCESS: 2.9 mmol/L — AB (ref 0.0–2.0)
Bicarbonate: 25.2 mEq/L — ABNORMAL HIGH (ref 20.0–24.0)
Drawn by: 437071
FIO2: 0.4
MECHVT: 550 mL
O2 SAT: 98 %
PATIENT TEMPERATURE: 98.7
PCO2 ART: 28.2 mmHg — AB (ref 35.0–45.0)
PEEP/CPAP: 5 cmH2O
PH ART: 7.56 — AB (ref 7.350–7.450)
RATE: 35 resp/min
TCO2: 26.1 mmol/L (ref 0–100)
pO2, Arterial: 93.9 mmHg (ref 80.0–100.0)

## 2016-05-09 LAB — CULTURE, RESPIRATORY: CULTURE: NORMAL

## 2016-05-09 LAB — BASIC METABOLIC PANEL
ANION GAP: 14 (ref 5–15)
Anion gap: 17 — ABNORMAL HIGH (ref 5–15)
BUN: 41 mg/dL — AB (ref 6–20)
BUN: 31 mg/dL — AB (ref 6–20)
CALCIUM: 7.7 mg/dL — AB (ref 8.9–10.3)
CO2: 26 mmol/L (ref 22–32)
CO2: 27 mmol/L (ref 22–32)
CREATININE: 1.51 mg/dL — AB (ref 0.61–1.24)
Calcium: 7.2 mg/dL — ABNORMAL LOW (ref 8.9–10.3)
Chloride: 105 mmol/L (ref 101–111)
Chloride: 92 mmol/L — ABNORMAL LOW (ref 101–111)
Creatinine, Ser: 1.65 mg/dL — ABNORMAL HIGH (ref 0.61–1.24)
GFR calc Af Amer: 47 mL/min — ABNORMAL LOW (ref >60)
GFR calc Af Amer: 53 mL/min — ABNORMAL LOW (ref >60)
GFR calc non Af Amer: 41 mL/min — ABNORMAL LOW (ref >60)
GFR, EST NON AFRICAN AMERICAN: 45 mL/min — AB (ref >60)
Glucose, Bld: 185 mg/dL — ABNORMAL HIGH (ref 65–99)
Glucose, Bld: 465 mg/dL — ABNORMAL HIGH (ref 65–99)
POTASSIUM: 2.8 mmol/L — AB (ref 3.5–5.1)
Potassium: 3.5 mmol/L (ref 3.5–5.1)
SODIUM: 145 mmol/L (ref 135–145)
SODIUM: 136 mmol/L (ref 135–145)

## 2016-05-09 LAB — PHOSPHORUS
PHOSPHORUS: 1.1 mg/dL — AB (ref 2.5–4.6)
PHOSPHORUS: 3.6 mg/dL (ref 2.5–4.6)
PHOSPHORUS: 20.7 mg/dL — AB (ref 2.5–4.6)

## 2016-05-09 LAB — MAGNESIUM
MAGNESIUM: 1.9 mg/dL (ref 1.7–2.4)
MAGNESIUM: 1.9 mg/dL (ref 1.7–2.4)
MAGNESIUM: 2 mg/dL (ref 1.7–2.4)

## 2016-05-09 LAB — CBC
HEMATOCRIT: 32.1 % — AB (ref 39.0–52.0)
HEMOGLOBIN: 10.9 g/dL — AB (ref 13.0–17.0)
MCH: 32.9 pg (ref 26.0–34.0)
MCHC: 34 g/dL (ref 30.0–36.0)
MCV: 97 fL (ref 78.0–100.0)
PLATELETS: 104 10*3/uL — AB (ref 150–400)
RBC: 3.31 MIL/uL — AB (ref 4.22–5.81)
RDW: 13.9 % (ref 11.5–15.5)
WBC: 7 10*3/uL (ref 4.0–10.5)

## 2016-05-09 LAB — GLUCOSE, CAPILLARY
GLUCOSE-CAPILLARY: 131 mg/dL — AB (ref 65–99)
Glucose-Capillary: 197 mg/dL — ABNORMAL HIGH (ref 65–99)
Glucose-Capillary: 202 mg/dL — ABNORMAL HIGH (ref 65–99)
Glucose-Capillary: 130 mg/dL — ABNORMAL HIGH (ref 65–99)

## 2016-05-09 LAB — CULTURE, RESPIRATORY W GRAM STAIN

## 2016-05-09 LAB — TROPONIN I: Troponin I: 1.46 ng/mL (ref <0.031)

## 2016-05-09 LAB — PROCALCITONIN: Procalcitonin: 0.52 ng/mL

## 2016-05-09 MED ORDER — CARVEDILOL 3.125 MG PO TABS
3.1250 mg | ORAL_TABLET | Freq: Two times a day (BID) | ORAL | Status: DC
  Administered 2016-05-09 – 2016-05-12 (×6): 3.125 mg via ORAL
  Filled 2016-05-09 (×6): qty 1

## 2016-05-09 MED ORDER — POTASSIUM CHLORIDE 10 MEQ/50ML IV SOLN
10.0000 meq | INTRAVENOUS | Status: AC
  Administered 2016-05-09 (×2): 10 meq via INTRAVENOUS
  Filled 2016-05-09 (×2): qty 50

## 2016-05-09 MED ORDER — SODIUM PHOSPHATES 45 MMOLE/15ML IV SOLN
30.0000 mmol | Freq: Once | INTRAVENOUS | Status: AC
  Administered 2016-05-09: 30 mmol via INTRAVENOUS
  Filled 2016-05-09: qty 10

## 2016-05-09 MED ORDER — MAGNESIUM SULFATE 2 GM/50ML IV SOLN
2.0000 g | Freq: Once | INTRAVENOUS | Status: AC
  Administered 2016-05-09: 2 g via INTRAVENOUS
  Filled 2016-05-09: qty 50

## 2016-05-09 MED ORDER — POTASSIUM CHLORIDE 10 MEQ/50ML IV SOLN
10.0000 meq | INTRAVENOUS | Status: AC
  Administered 2016-05-09 (×4): 10 meq via INTRAVENOUS
  Filled 2016-05-09 (×4): qty 50

## 2016-05-09 MED ORDER — CARVEDILOL 3.125 MG PO TABS: 3.1250 mg | ORAL_TABLET | Freq: Two times a day (BID) | ORAL | Status: DC

## 2016-05-09 MED ORDER — ISOSORB DINITRATE-HYDRALAZINE 20-37.5 MG PO TABS
1.0000 | ORAL_TABLET | Freq: Two times a day (BID) | ORAL | Status: DC
  Administered 2016-05-09 – 2016-05-10 (×2): 1 via ORAL
  Filled 2016-05-09 (×2): qty 1

## 2016-05-09 MED ORDER — FUROSEMIDE 10 MG/ML IJ SOLN
40.0000 mg | Freq: Once | INTRAMUSCULAR | Status: AC
  Administered 2016-05-09: 40 mg via INTRAVENOUS

## 2016-05-09 MED ORDER — POTASSIUM PHOSPHATES 15 MMOLE/5ML IV SOLN: 30.0000 mmol | Freq: Once | INTRAVENOUS | Status: DC

## 2016-05-09 MED FILL — Nitroglycerin IV Soln 100 MCG/ML in D5W: INTRA_ARTERIAL | Qty: 10 | Status: AC

## 2016-05-09 NOTE — Progress Notes (Signed)
Subjective:  Patient awake follows commands remains intubated denies any chest pain.   Objective:  Vital Signs in the last 24 hours: Temp:  [98.1 F (36.7 C)-99.3 F (37.4 C)] 99.3 F (37.4 C) (05/16 0400) Pulse Rate:  [66-96] 84 (05/16 0800) Resp:  [16-35] 24 (05/16 0800) BP: (93-131)/(39-87) 101/56 mmHg (05/16 0800) SpO2:  [97 %-100 %] 98 % (05/16 0800) FiO2 (%):  [40 %-60 %] 40 % (05/16 0742) Weight:  [85.7 kg (188 lb 15 oz)] 85.7 kg (188 lb 15 oz) (05/16 0300)  Intake/Output from previous day: 05/15 0701 - 05/16 0700 In: 12906.9 [I.V.:10806.9; NG/GT:1450; IV Piggyback:650] Out: 2585 [Urine:2585] Intake/Output from this shift: Total I/O In: 131.7 [I.V.:26.7; NG/GT:105] Out: 700 [Urine:700]  Physical Exam: Neck: no adenopathy, no carotid bruit, no JVD, supple, symmetrical, trachea midline and thyroid not enlarged, symmetric, no tenderness/mass/nodules Lungs: Clear to auscultation anteriorly, and decreased breath sound at bases with faint rales and rhonchi Heart: regular rate and rhythm, S1, S2 normal and Soft systolic murmur noted Abdomen: soft, non-tender; bowel sounds normal; no masses,  no organomegaly Extremities: extremities normal, atraumatic, no cyanosis or edema and Right groin stable  Lab Results:  Recent Labs  05/08/16 0420 05/09/16 0400  WBC 9.0 7.0  HGB 12.5* 10.9*  PLT 137* 104*    Recent Labs  05/07/16 2340 05/08/16 0420  NA 143 144  K 3.7 3.9  CL 110 109  CO2 24 24  GLUCOSE 174* 193*  BUN 39* 37*  CREATININE 1.69* 1.68*    Recent Labs  05/07/16 0428 05/07/16 2340  TROPONINI 5.02* 2.57*   Hepatic Function Panel No results for input(s): PROT, ALBUMIN, AST, ALT, ALKPHOS, BILITOT, BILIDIR, IBILI in the last 72 hours. No results for input(s): CHOL in the last 72 hours. No results for input(s): PROTIME in the last 72 hours.  Imaging: Imaging results have been reviewed and [name] 1 View  05/09/2016  CLINICAL DATA:  Acute  respiratory failure. Shortness of breath. Hypertension. EXAM: PORTABLE CHEST 1 VIEW COMPARISON:  05/08/2016 FINDINGS: Endotracheal tube with tip measuring 4.6 cm above the carina. Enteric tube tip is off the field of view but below the left hemidiaphragm. Right central venous catheter projects over the cavoatrial junction. No pneumothorax. Heart size and pulmonary vascularity are normal. Bilateral perihilar airspace disease likely representing edema although could be pneumonia. This appears to be slightly improved since previous study. Calcified and tortuous aorta IMPRESSION: Appliances appear in satisfactory location. Diffuse bilateral perihilar infiltration, likely edema, mild improvement since previous study Electronically Signed   By: Lucienne Capers M.D.   On: 05/09/2016 04:02   [name] 1 View  05/08/2016  CLINICAL DATA:  Endotracheal tube present EXAM: PORTABLE CHEST 1 VIEW COMPARISON:  05/07/2016; 05/06/2016; 05/05/2016 FINDINGS: Grossly unchanged enlarged cardiac silhouette and mediastinal contours. Stable positioning of support apparatus. No pneumothorax. The pulmonary vasculature remains indistinct with grossly unchanged perihilar airspace opacities with potential air bronchograms noted about the bilateral pulmonary hila, left greater than right. No new focal airspace opacities. No definite pleural effusion, though note, the right costophrenic angle is excluded from view. Unchanged bones. IMPRESSION: 1.  Stable positioning of support apparatus.  No pneumothorax. 2. Similar findings most suggestive of alveolar pulmonary edema. Electronically Signed   By: Sandi Mariscal M.D.   On: 05/08/2016 07:17    Cardiac Studies:  Assessment/Plan:  Status post Acute small non-Q-wave myocardial infarction complicated by acute pulmonary edema Status post sustained VT  Inferoposterior wall acute injury pattern  in the setting of marked sinus tachycardia/VT status post left cath and attempted PCI to left  circumflex and RCA. Ischemic dilated cardiomyopathy Multivessel CAD Hyperlipidemia New-onset diabetes mellitus Acute respiratory failure secondary to above/possible bilateral pneumonia Status post hypertensive emergency Tobacco abuse Acute kidney injury secondary to hypotension/contrast  Plan Lasix 40 mg IV 1 extra dose now Reduce amiodarone to 15 mg per hour Start carvedilol 3.125 mg twice daily Monitor renal function will start Ace inhibitors once renal function is stable and blood pressure tolerates  LOS: 4 days    Gregory Dyer 05/09/2016, 8:43 AM

## 2016-05-09 NOTE — Progress Notes (Signed)
Pt QTc still prolonged post 12 lead ekg this afternoon. Will continue Amiodarone at 15 mg per hour Per Cards MD order.

## 2016-05-09 NOTE — Progress Notes (Signed)
PULMONARY / CRITICAL CARE MEDICINE   Name: Gregory Dyer MRN: [id number] DOB: 1946/01/12    ADMISSION DATE:  05/05/2016  REFERRING MD:  ER  CHIEF COMPLAINT:  Short of breath  HISTORY OF PRESENT ILLNESS:   70 yo male developed productive cough 3 days prior to admission.  Over 24 hours prior to admission he had sudden onset of dyspnea that became progressively worse.  He was also having diaphoresis.  In ER he had SpO2 in 50's.  He required intubation.  He had BP 196/113 on arrival to ER, and temperature of 95.9.  WBC and Lactic acid elevated.  CXR showed diffuse, patchy ASD concerning for pneumonia.  There was concern for NSTEMI >> Dr. Terrence Dupont was consulted by EDP, and recommend to start heparin gtt.  He was started on antibiotics in ER for community acquired pneumonia.   STUDIES:  CXR 5/12 > B/L opacities. Images reviewed  CULTURES: 5/12 Blood >> 5/12 Sputum >> 5/12 Pneumococcal Ag >>neg 5/12 Legionella Ag >>neg 5/12 Respiratory viral panel >>  Neg 5/12 - urine culture - negatiove  ANTIBIOTICS: 5/12 Rocephin >> 5/12 Zithromax >> Vanco  5/15 >> Zosyn 5/14 >>  SIGNIFICANT EVENTS: 5/12 Admit, cardiology consulted, start heparin gtt. 5/12 ETT >>  05/06/16 - failed SBT.  5/14-  Cardiac arrest with VT, urgent cath.   SUBJECTIVE/OVERNIGHT/INTERVAL HX Had a cardiac arrest with sustained ventricular tachycardia. Taken urgently to cath which showed chronic disease with no acute changes. Elevated LVEDP at 45. Now acidotic.   VITAL SIGNS: BP 167/70 mmHg  Pulse 109  Temp(Src) 99.2 F (37.3 C) (Oral)  Resp 25  Ht 5\' 8"  (1.727 m)  Wt 188 lb 15 oz (85.7 kg)  BMI 28.73 kg/m2  SpO2 95%  HEMODYNAMICS:    VENTILATOR SETTINGS: Vent Mode:  [-] CPAP;PSV FiO2 (%):  [40 %-60 %] 40 % Set Rate:  [24 bmp-35 bmp] 24 bmp Vt Set:  [550 mL] 550 mL PEEP:  [5 cmH20] 5 cmH20 Pressure Support:  [10 cmH20] 10 cmH20 Plateau Pressure:  [21 cmH20-28 cmH20] 21 cmH20  INTAKE / OUTPUT: I/O last 3  completed shifts: In: 14405.4 [I.V.:12035.4; NG/GT:1720; IV Piggyback:650] Out: [id number] [Urine:3810]  PHYSICAL EXAMINATION: General: Sedated, on vent.  Neuro:  No focal defects. Follows commands,. RASS 0 HEENT:  PERRL, No thyromegaly, JVD Cardiovascular:  RRR, No MRG Lungs:  Clear, no wheeze, crackles Abdomen:  Obese, soft, distended, + BS Musculoskeletal:  Normal tone and bulk, no edema. Skin:  Intact  LABS:  PULMONARY  Recent Labs Lab 05/05/16 1830 05/06/16 0500 05/08/16 0227 05/08/16 0434 05/09/16 0410  PHART 7.239* 7.389 7.148* 7.454* 7.560*  PCO2ART 49.0* 35.1 78.2* 34.0* 28.2*  PO2ART 309* 108* 155.0* 158.0* 93.9  HCO3 20.2 20.6 27.1* 23.9 25.2*  TCO2 18.3 18.0 29 25 26.1  O2SAT 99.2 97.9 99.0 100.0 98.0    CBC  Recent Labs Lab 05/07/16 0428 05/08/16 0420 05/09/16 0400  HGB 12.9* 12.5* 10.9*  HCT 37.9* 37.8* 32.1*  WBC 7.8 9.0 7.0  PLT 134* 137* 104*    COAGULATION  Recent Labs Lab 05/05/16 1545  INR 1.06    CARDIAC    Recent Labs Lab 05/06/16 1700 05/06/16 2303 05/07/16 0428 05/07/16 2340 05/09/16 0529  TROPONINI 5.93* 5.03* 5.02* 2.57* 1.46*   No results for input(s): PROBNP in the last 168 hours.   CHEMISTRY  Recent Labs Lab 05/05/16 1545 05/06/16 0500 05/07/16 0428 05/07/16 2340 05/08/16 0420 05/09/16 0529  NA 141 141 142 143 144 145  K  3.5 3.7 3.6 3.7 3.9 2.8*  CL 103 109 111 110 109 105  CO2 18* 23 24 24 24 26   GLUCOSE 215* 117* 149* 174* 193* 185*  BUN 16 17 27* 39* 37* 41*  CREATININE 1.27* 0.99 1.21 1.69* 1.68* 1.65*  CALCIUM 9.5 7.6* 7.4* 7.7* 6.9* 7.7*  MG 2.1 1.3* 2.1 1.8 2.0 1.9  PHOS 6.2* 1.9*  --  2.0* 3.6 1.1*   Estimated Creatinine Clearance: 45 mL/min (by C-G formula based on Cr of 1.65).   LIVER  Recent Labs Lab 05/05/16 1545 05/06/16 0500  AST 58* 34  ALT 17 17  ALKPHOS 78 52  BILITOT 1.1 0.8  PROT 8.0 5.3*  ALBUMIN 4.4 2.9*  INR 1.06  --      INFECTIOUS  Recent Labs Lab  05/05/16 1612 05/05/16 1813 05/05/16 1953 05/06/16 0500 05/07/16 0428  LATICACIDVEN 10.30*  --  1.6  --  1.3  PROCALCITON  --  <0.10  --  0.55 0.69     ENDOCRINE CBG (last 3)   Recent Labs  05/08/16 2356 05/09/16 0349 05/09/16 0856  GLUCAP 151* 197* 202*   IMAGING x48h  - image(s) personally visualized  -   highlighted in bold [name] 1 View  05/09/2016  CLINICAL DATA:  Acute respiratory failure. Shortness of breath. Hypertension. EXAM: PORTABLE CHEST 1 VIEW COMPARISON:  05/08/2016 FINDINGS: Endotracheal tube with tip measuring 4.6 cm above the carina. Enteric tube tip is off the field of view but below the left hemidiaphragm. Right central venous catheter projects over the cavoatrial junction. No pneumothorax. Heart size and pulmonary vascularity are normal. Bilateral perihilar airspace disease likely representing edema although could be pneumonia. This appears to be slightly improved since previous study. Calcified and tortuous aorta IMPRESSION: Appliances appear in satisfactory location. Diffuse bilateral perihilar infiltration, likely edema, mild improvement since previous study Electronically Signed   By: Lucienne Capers M.D.   On: 05/09/2016 04:02   [name] 1 View  05/08/2016  CLINICAL DATA:  Endotracheal tube present EXAM: PORTABLE CHEST 1 VIEW COMPARISON:  05/07/2016; 05/06/2016; 05/05/2016 FINDINGS: Grossly unchanged enlarged cardiac silhouette and mediastinal contours. Stable positioning of support apparatus. No pneumothorax. The pulmonary vasculature remains indistinct with grossly unchanged perihilar airspace opacities with potential air bronchograms noted about the bilateral pulmonary hila, left greater than right. No new focal airspace opacities. No definite pleural effusion, though note, the right costophrenic angle is excluded from view. Unchanged bones. IMPRESSION: 1.  Stable positioning of support apparatus.  No pneumothorax. 2. Similar findings most suggestive  of alveolar pulmonary edema. Electronically Signed   By: Sandi Mariscal M.D.   On: 05/08/2016 07:17   DISCUSSION: 70 yo male smoker with acute hypoxic/hypercapnic respiratory failure, productive cough, leukocytosis, lactic acidosis, HTN emergency, b/l pulmonary infiltrates concerning for pneumonia, and NSTEMI. Cath reveals severe decompensated heart failure.  ASSESSMENT / PLAN:  PULMONARY A: Acute hypoxic/hypercapnic respiratory failure 2nd to b/l pulmonary infiltrates concerning for CAP +/- acute pulmonary edema. Tobacco abuse. P:   Doing well on weaning trial. Hopeful for extubation today.  CARDIOVASCULAR A:  HTN emergency. NSTEMI likely from demand ischemia. S/P cardiac cath after VT. EF appeared to be 15-20% with global hypokinesis P:  Amio. Off lidocaine Lasix for diuresis Liptor, ASA Start Coreg  RENAL A:   AKI - Stable P:   Monitor urine output and cr Replete K  GASTROINTESTINAL A:   Nutrition. P:   Hold tube feeds for extubation.  HEMATOLOGIC A:  Leukocytosis, polycythemia > improved P:  F/u CBC  INFECTIOUS A:   Sepsis from community acquired pneumonia.   P:   Restarted on vanco, zosyn for fevers Follow cultures, HIV test   ENDOCRINE A:   Hyperglycemia >> no reported hx of DM. P:   SSI  NEUROLOGIC A:   Acute encephalopathy 2nd to respiratory failure, sepsis/pneumonia. UDS negative 05/05/16 P:   RASS goal: -1  FAMILY  - Updates: Fiance updated at bedside 5/12 and 05/06/16. Pt is estranged from rest of his  family. None at bedside 05/09/2016 - Inter-disciplinary family meet or Palliative Care meeting due by:  5/19  Critical care time- 35 mins.  Marshell Garfinkel MD [facility] Pager [phone] If no answer or after 3pm call: [phone] 05/09/2016, 11:37 AM

## 2016-05-09 NOTE — Progress Notes (Addendum)
Pt extubated at 1150am. At 1445 bedside swallow done. Pt given ice chips-no coughing, clearing throat, gagging or shortness of breath noted. Pt given water to drink, no coughing, gagging, clearing throat, nor shortness of breath noted. Will continue to monitor patient closely.

## 2016-05-09 NOTE — Progress Notes (Signed)
[facility] Progress Note Patient Name: Gregory Dyer DOB: 1946-07-15 MRN: [id number]   Date of Service  05/09/2016  HPI/Events of Note  resp alk  eICU Interventions  erduce rr on vent     Intervention Category Major Interventions: Other:  Tomie Spizzirri 05/09/2016, 4:42 AM

## 2016-05-09 NOTE — Progress Notes (Signed)
Noted pt on monitor in V. Tach with a rate of 180s. RN into room to assess patient. Pt alert and talking, VSS, valsalva maneuvers attempted. Pt spontaneously converted back to ST. Attending MD and Cardiology notified. New orders received and followed. Will continue to monitor closely.

## 2016-05-09 NOTE — Progress Notes (Signed)
51mL of Versed gtt and 6mL Fentanyl of gtt wasted in sink, witnessed by 2 RNs (Worthy Keeler, RN and Delbert Phenix, RN).

## 2016-05-09 NOTE — Procedures (Signed)
Extubation Procedure Note  Patient Details:   Name: Gregory Dyer DOB: [birth year] MRN: [id number]   Airway Documentation:     Evaluation  O2 sats: stable throughout Complications: No apparent complications Patient did tolerate procedure well. Bilateral Breath Sounds: Diminished   Yes   Positive cuff leak noted [prior to extubation. Patient placed on nasal cannula 6 L with humidity. Mild stridor noted, RN and MD notified. E Link notified of extubation.  Mingo Amber Haru Shaff 05/09/2016, 11:50 AM

## 2016-05-10 ENCOUNTER — Inpatient Hospital Stay (HOSPITAL_COMMUNITY): Payer: Medicare Other

## 2016-05-10 LAB — BASIC METABOLIC PANEL
ANION GAP: 10 (ref 5–15)
Anion gap: 13 (ref 5–15)
BUN: 32 mg/dL — ABNORMAL HIGH (ref 6–20)
BUN: 30 mg/dL — AB (ref 6–20)
CALCIUM: 8 mg/dL — AB (ref 8.9–10.3)
CHLORIDE: 100 mmol/L — AB (ref 101–111)
CO2: 31 mmol/L (ref 22–32)
CO2: 30 mmol/L (ref 22–32)
CREATININE: 1.49 mg/dL — AB (ref 0.61–1.24)
Calcium: 7.6 mg/dL — ABNORMAL LOW (ref 8.9–10.3)
Chloride: 103 mmol/L (ref 101–111)
Creatinine, Ser: 1.45 mg/dL — ABNORMAL HIGH (ref 0.61–1.24)
GFR calc Af Amer: 53 mL/min — ABNORMAL LOW (ref >60)
GFR calc Af Amer: 55 mL/min — ABNORMAL LOW (ref >60)
GFR calc non Af Amer: 46 mL/min — ABNORMAL LOW (ref >60)
GFR calc non Af Amer: 48 mL/min — ABNORMAL LOW (ref >60)
GLUCOSE: 129 mg/dL — AB (ref 65–99)
Glucose, Bld: 122 mg/dL — ABNORMAL HIGH (ref 65–99)
POTASSIUM: 3.3 mmol/L — AB (ref 3.5–5.1)
Potassium: 3.8 mmol/L (ref 3.5–5.1)
SODIUM: 143 mmol/L (ref 135–145)
Sodium: 144 mmol/L (ref 135–145)

## 2016-05-10 LAB — CBC
HEMATOCRIT: 33.3 % — AB (ref 39.0–52.0)
HEMOGLOBIN: 10.7 g/dL — AB (ref 13.0–17.0)
MCH: 32 pg (ref 26.0–34.0)
MCHC: 32.1 g/dL (ref 30.0–36.0)
MCV: 99.7 fL (ref 78.0–100.0)
Platelets: 124 10*3/uL — ABNORMAL LOW (ref 150–400)
RBC: 3.34 MIL/uL — AB (ref 4.22–5.81)
RDW: 13.9 % (ref 11.5–15.5)
WBC: 8.1 10*3/uL (ref 4.0–10.5)

## 2016-05-10 LAB — GLUCOSE, CAPILLARY
GLUCOSE-CAPILLARY: 131 mg/dL — AB (ref 65–99)
GLUCOSE-CAPILLARY: 160 mg/dL — AB (ref 65–99)
GLUCOSE-CAPILLARY: 111 mg/dL — AB (ref 65–99)
Glucose-Capillary: 141 mg/dL — ABNORMAL HIGH (ref 65–99)
Glucose-Capillary: 392 mg/dL — ABNORMAL HIGH (ref 65–99)
Glucose-Capillary: 97 mg/dL (ref 65–99)

## 2016-05-10 LAB — CULTURE, BLOOD (ROUTINE X 2)
Culture: NO GROWTH
Culture: NO GROWTH

## 2016-05-10 LAB — PROCALCITONIN: PROCALCITONIN: 0.31 ng/mL

## 2016-05-10 LAB — PHOSPHORUS: Phosphorus: 4 mg/dL (ref 2.5–4.6)

## 2016-05-10 LAB — MAGNESIUM: MAGNESIUM: 2.1 mg/dL (ref 1.7–2.4)

## 2016-05-10 MED ORDER — ENSURE ENLIVE PO LIQD
237.0000 mL | Freq: Two times a day (BID) | ORAL | Status: DC
  Administered 2016-05-10 – 2016-05-12 (×2): 237 mL via ORAL

## 2016-05-10 MED ORDER — SACUBITRIL-VALSARTAN 24-26 MG PO TABS
1.0000 | ORAL_TABLET | Freq: Two times a day (BID) | ORAL | Status: DC
  Administered 2016-05-10 – 2016-05-12 (×5): 1 via ORAL
  Filled 2016-05-10 (×7): qty 1

## 2016-05-10 MED ORDER — AMIODARONE HCL 200 MG PO TABS
200.0000 mg | ORAL_TABLET | Freq: Every day | ORAL | Status: DC
  Administered 2016-05-10 – 2016-05-12 (×3): 200 mg via ORAL
  Filled 2016-05-10 (×3): qty 1

## 2016-05-10 MED ORDER — INSULIN ASPART 100 UNIT/ML ~~LOC~~ SOLN
0.0000 [IU] | Freq: Three times a day (TID) | SUBCUTANEOUS | Status: DC
  Administered 2016-05-10 – 2016-05-11 (×4): 4 [IU] via SUBCUTANEOUS
  Administered 2016-05-11: 7 [IU] via SUBCUTANEOUS
  Administered 2016-05-11 – 2016-05-12 (×2): 3 [IU] via SUBCUTANEOUS
  Administered 2016-05-12: 4 [IU] via SUBCUTANEOUS

## 2016-05-10 MED ORDER — POTASSIUM CHLORIDE 20 MEQ/15ML (10%) PO SOLN
40.0000 meq | Freq: Once | ORAL | Status: AC
  Administered 2016-05-10: 40 meq via ORAL
  Filled 2016-05-10: qty 30

## 2016-05-10 MED ORDER — ISOSORBIDE MONONITRATE ER 30 MG PO TB24
30.0000 mg | ORAL_TABLET | Freq: Every day | ORAL | Status: DC
  Administered 2016-05-10 – 2016-05-12 (×3): 30 mg via ORAL
  Filled 2016-05-10 (×3): qty 1

## 2016-05-10 MED ORDER — POTASSIUM CHLORIDE CRYS ER 20 MEQ PO TBCR
20.0000 meq | EXTENDED_RELEASE_TABLET | ORAL | Status: AC
  Administered 2016-05-10 (×2): 20 meq via ORAL
  Filled 2016-05-10 (×2): qty 1

## 2016-05-10 MED ORDER — FUROSEMIDE 10 MG/ML IJ SOLN
40.0000 mg | Freq: Two times a day (BID) | INTRAMUSCULAR | Status: DC
  Administered 2016-05-10 – 2016-05-11 (×3): 40 mg via INTRAVENOUS
  Filled 2016-05-10 (×4): qty 4

## 2016-05-10 MED ORDER — POTASSIUM CHLORIDE CRYS ER 10 MEQ PO TBCR
EXTENDED_RELEASE_TABLET | ORAL | Status: AC
  Filled 2016-05-10: qty 1

## 2016-05-10 NOTE — Care Management Important Message (Signed)
Important Message  Patient Details  Name: Gregory Dyer MRN: [id number] Date of Birth: 10/19/1945   Medicare Important Message Given:  Yes    Lacretia Leigh, RN 05/10/2016, 11:38 AM

## 2016-05-10 NOTE — Progress Notes (Signed)
[facility] ADULT ICU REPLACEMENT PROTOCOL FOR AM LAB REPLACEMENT ONLY  The patient does apply for the [facility] Adult ICU Electrolyte Replacment Protocol based on the criteria listed below:   1. Is GFR >/= 40 ml/min? No.  Patient's GFR today is 48 2. Is urine output >/= 0.5 ml/kg/hr for the last 6 hours? Yes.   Patient's UOP is 1.3 ml/kg/hr 3. Is BUN < 60 mg/dL? Yes.    Patient's BUN today is 30 4. Abnormal electrolyte(s):K3.3 5. Ordered repletion with: per protocol 6. If a panic level lab has been reported, has the CCM MD in charge been notified? Yes.  .   Physician:  Ann Lions, MD  Vear Clock 05/10/2016 6:20 AM

## 2016-05-10 NOTE — Progress Notes (Signed)
Nutrition Follow-up  DOCUMENTATION CODES:   Not applicable  INTERVENTION:  Ensure Enlive BID. Each supplement provides 350 kcals and 20 grams of protein.    NUTRITION DIAGNOSIS:   Inadequate oral intake related to poor appetite as evidenced by per patient/family report. Ongoing   GOAL:   Patient will meet greater than or equal to 90% of their needs  Unmet   MONITOR:   PO intake, Supplement acceptance, Labs, Weight trends, Skin, I & O's  ASSESSMENT:   70 y.o. male With History of tobacco use disorder, hypertension, hyperlipidemia who was admitted to the hospital with 2-3 days onset of cough, also states that he has been having chest pain for the past 2 days prior to the hospital admission.  5/12-  Admit, cardiology consulted 5/13 - failed SBT.  5/14 - Cardiac arrest with VT, urgent cath.  5/16 - Extubated, stopped tube feeding  Diet advanced to heart healthy diet. Needs re-estimated.  Weight currently at 188 lbs, down 12 lbs since admission. Pt is +6 L since admission but fluid retention is trending down daily for past 2 days (-1 L in past 24 hours).   Pt states he is eating well. Per chart, PO's are 50%.  Pt amenable to Ensure, he prefers chocolate. Will send BID between meals.   Labs reviewed; CBGs 130-392, K 3.3, Cl 100, BUN 30, Creat 1.45, Ca 7.6, GFR 48. Meds reviewed; Lasix 60 mg TID.  Diet Order:  Diet heart healthy/carb modified Room service appropriate?: Yes; Fluid consistency:: Thin  Skin:  Reviewed, no issues  Last BM:  unknown  Height:   Ht Readings from Last 1 Encounters:  05/05/16 5\' 8"  (1.727 m)    Weight:   Wt Readings from Last 1 Encounters:  05/09/16 188 lb 15 oz (85.7 kg)    Ideal Body Weight:  70 kg  BMI:  Body mass index is 28.73 kg/(m^2).  Estimated Nutritional Needs:   Kcal:  2000-2200  Protein:  120-130  Fluid:  2 L  EDUCATION NEEDS:   No education needs identified at this time  Geoffery Lyons, [place] Dietetic  Intern Pager [phone]

## 2016-05-10 NOTE — Progress Notes (Signed)
PULMONARY / CRITICAL CARE MEDICINE   Name: Gregory Dyer MRN: [id number] DOB: 10/19/1945    ADMISSION DATE:  05/05/2016  REFERRING MD:  ER  CHIEF COMPLAINT:  Short of breath  HISTORY OF PRESENT ILLNESS:   69 yo male developed productive cough 3 days prior to admission.  Over 24 hours prior to admission he had sudden onset of dyspnea that became progressively worse.  He was also having diaphoresis.  In ER he had SpO2 in 50's.  He required intubation.  He had BP 196/113 on arrival to ER, and temperature of 95.9.  WBC and Lactic acid elevated.  CXR showed diffuse, patchy ASD concerning for pneumonia.  There was concern for NSTEMI >> Dr. Terrence Dupont was consulted by EDP, and recommend to start heparin gtt.  He was started on antibiotics in ER for community acquired pneumonia.   STUDIES:  CXR 5/12 > B/L opacities. Images reviewed  CULTURES: 5/12 Blood >> 5/12 Sputum >> 5/12 Pneumococcal Ag >>neg 5/12 Legionella Ag >>neg 5/12 Respiratory viral panel >>  Neg 5/12 - urine culture - negatiove  ANTIBIOTICS: 5/12 Rocephin >> 5/12 Zithromax >> Vanco  5/15 >> Zosyn 5/14 >>  SIGNIFICANT EVENTS: 5/12 Admit, cardiology consulted, start heparin gtt. 5/12 ETT >>  05/06/16 - failed SBT.  5/14-  Cardiac arrest with VT, urgent cath.   SUBJECTIVE/OVERNIGHT/INTERVAL HX Stable post extubation.  VITAL SIGNS: BP 103/52 mmHg  Pulse 72  Temp(Src) 98.5 F (36.9 C) (Oral)  Resp 23  Ht 5\' 8"  (1.727 m)  Wt 188 lb 15 oz (85.7 kg)  BMI 28.73 kg/m2  SpO2 97%  HEMODYNAMICS:    VENTILATOR SETTINGS:    INTAKE / OUTPUT: I/O last 3 completed shifts: In: 13654.7 [P.O.:720; I.V.:10374.9; NG/GT:949.8; IV Piggyback:1610] Out: 5780 [Urine:5780]  PHYSICAL EXAMINATION: General: Sedated, on vent.  Neuro:  No focal defects. Follows commands,. RASS 0 HEENT:  PERRL, No thyromegaly, JVD Cardiovascular:  RRR, No MRG Lungs:  Clear, no wheeze, crackles Abdomen:  Obese, soft, distended, +  BS Musculoskeletal:  Normal tone and bulk, no edema. Skin:  Intact  LABS:  PULMONARY  Recent Labs Lab 05/05/16 1830 05/06/16 0500 05/08/16 0227 05/08/16 0434 05/09/16 0410  PHART 7.239* 7.389 7.148* 7.454* 7.560*  PCO2ART 49.0* 35.1 78.2* 34.0* 28.2*  PO2ART 309* 108* 155.0* 158.0* 93.9  HCO3 20.2 20.6 27.1* 23.9 25.2*  TCO2 18.3 18.0 29 25 26.1  O2SAT 99.2 97.9 99.0 100.0 98.0    CBC  Recent Labs Lab 05/08/16 0420 05/09/16 0400 05/10/16 0445  HGB 12.5* 10.9* 10.7*  HCT 37.8* 32.1* 33.3*  WBC 9.0 7.0 8.1  PLT 137* 104* 124*    COAGULATION  Recent Labs Lab 05/05/16 1545  INR 1.06    CARDIAC    Recent Labs Lab 05/06/16 1700 05/06/16 2303 05/07/16 0428 05/07/16 2340 05/09/16 0529  TROPONINI 5.93* 5.03* 5.02* 2.57* 1.46*   No results for input(s): PROBNP in the last 168 hours.   CHEMISTRY  Recent Labs Lab 05/08/16 0420 05/09/16 0529 05/09/16 1230 05/09/16 2115 05/09/16 2234 05/10/16 0445  NA 144 145  --  136 144 143  K 3.9 2.8*  --  3.5 3.8 3.3*  CL 109 105  --  92* 103 100*  CO2 24 26  --  27 31 30   GLUCOSE 193* 185*  --  465* 129* 122*  BUN 37* 41*  --  31* 32* 30*  CREATININE 1.68* 1.65*  --  1.51* 1.49* 1.45*  CALCIUM 6.9* 7.7*  --  7.2* 8.0* 7.6*  MG 2.0 1.9 1.9 2.0  --  2.1  PHOS 3.6 1.1* 3.6 20.7*  --  4.0   Estimated Creatinine Clearance: 50.5 mL/min (by C-G formula based on Cr of 1.45).   LIVER  Recent Labs Lab 05/05/16 1545 05/06/16 0500  AST 58* 34  ALT 17 17  ALKPHOS 78 52  BILITOT 1.1 0.8  PROT 8.0 5.3*  ALBUMIN 4.4 2.9*  INR 1.06  --      INFECTIOUS  Recent Labs Lab 05/05/16 1612  05/05/16 1953  05/07/16 0428 05/09/16 1230 05/10/16 0445  LATICACIDVEN 10.30*  --  1.6  --  1.3  --   --   PROCALCITON  --   < >  --   < > 0.69 0.52 0.31  < > = values in this interval not displayed.   ENDOCRINE CBG (last 3)   Recent Labs  05/09/16 2117 05/09/16 2124 05/10/16 0737  GLUCAP 392* 141* 131*    IMAGING x48h  - image(s) personally visualized  -   highlighted in bold [name] 1 View  05/10/2016  CLINICAL DATA:  Acute respiratory failure.  Shortness of breath. EXAM: PORTABLE CHEST 1 VIEW COMPARISON:  05/09/2016. FINDINGS: Endotracheal tube and NG tube have been removed. Right IJ line noted with tip projected over the right atrium. Stable cardiomegaly. Diffuse bilateral pulmonary alveolar infiltrates and/or edema have progressed. No interim change. No pleural effusion or pneumothorax . IMPRESSION: 1. Interim extubation removal of NG tube. Right IJ line stable position. 2. Interim progression of bilateral pulmonary infiltrates and/or edema. 3. Cardiomegaly. Electronically Signed   By: Marcello Moores  Register   On: 05/10/2016 07:40   [name] 1 View  05/09/2016  CLINICAL DATA:  Acute respiratory failure. Shortness of breath. Hypertension. EXAM: PORTABLE CHEST 1 VIEW COMPARISON:  05/08/2016 FINDINGS: Endotracheal tube with tip measuring 4.6 cm above the carina. Enteric tube tip is off the field of view but below the left hemidiaphragm. Right central venous catheter projects over the cavoatrial junction. No pneumothorax. Heart size and pulmonary vascularity are normal. Bilateral perihilar airspace disease likely representing edema although could be pneumonia. This appears to be slightly improved since previous study. Calcified and tortuous aorta IMPRESSION: Appliances appear in satisfactory location. Diffuse bilateral perihilar infiltration, likely edema, mild improvement since previous study Electronically Signed   By: Lucienne Capers M.D.   On: 05/09/2016 04:02   DISCUSSION: 70 yo male smoker with acute hypoxic/hypercapnic respiratory failure, productive cough, leukocytosis, lactic acidosis, HTN emergency, b/l pulmonary infiltrates concerning for pneumonia, and NSTEMI. Cath reveals severe decompensated heart failure.  ASSESSMENT / PLAN:  PULMONARY A: Acute hypoxic/hypercapnic respiratory  failure 2nd to b/l pulmonary infiltrates concerning for CAP +/- acute pulmonary edema. Tobacco abuse. P:   Stable post extubation.   CARDIOVASCULAR A:  HTN emergency. NSTEMI likely from demand ischemia. S/P cardiac cath after VT. EF appeared to be 15-20% with global hypokinesis P:  Amio. Lasix for diuresis asper cardiology Liptor, ASA Started Coreg  RENAL A:   AKI - Stable P:   Monitor urine output and cr Replete K  GASTROINTESTINAL A:   Nutrition. P:   PO diet  HEMATOLOGIC A:   Leukocytosis, polycythemia > improved P:  F/u CBC  INFECTIOUS A:   Sepsis from community acquired pneumonia.  Procalcitonin is improving  P:   Restarted on vanco, zosyn for fevers Will D/C vanco today. Stop zosyn tomorrow for total 7 days of abx  ENDOCRINE A:   Hyperglycemia >> no reported hx of  DM. P:   SSI  NEUROLOGIC A:   Acute encephalopathy 2nd to respiratory failure, sepsis/pneumonia. UDS negative 05/05/16 P:   Improved mental status.  FAMILY  - Updates: Fiance updated at bedside 5/12 and 05/06/16. Pt is estranged from rest of his  family. None at bedside 05/09/2016 - Inter-disciplinary family meet or Palliative Care meeting due by:  5/19  Critical care time- 35 mins.  Marshell Garfinkel MD [facility] Pager [phone] If no answer or after 3pm call: [phone] 05/10/2016, 11:46 AM

## 2016-05-10 NOTE — Progress Notes (Signed)
Foley removed at this time. Pt given urinal.

## 2016-05-10 NOTE — Progress Notes (Signed)
Subjective:  Patient up in chairs successfully extubated yesterday, doing well.  Denies any chest pain or shortness of breath.clinically improved.  X-ray shows post therapeutic gap.renal function improving  Objective:  Vital Signs in the last 24 hours: Temp:  [97.7 F (36.5 C)-98.7 F (37.1 C)] 98.5 F (36.9 C) (05/17 0749) Pulse Rate:  [65-121] 75 (05/17 1000) Resp:  [15-27] 21 (05/17 1000) BP: (89-174)/(53-97) 112/85 mmHg (05/17 1000) SpO2:  [83 %-99 %] 97 % (05/17 1000) FiO2 (%):  [40 %] 40 % (05/16 1100)  Intake/Output from previous day: 05/16 0701 - 05/17 0700 In: 2154.5 [P.O.:720; I.V.:109.7; NG/GT:264.8; IV Piggyback:1060] Out: 4790 [Urine:4790] Intake/Output from this shift: Total I/O In: 153.2 [P.O.:120; I.V.:33.2] Out: 1200 [Urine:1200]  Physical Exam: Neck: no adenopathy, no carotid bruit, supple, symmetrical, trachea midline and thyroid not enlarged, symmetric, no tenderness/mass/nodules Lungs: decreased breath sounds at bases with faint rales.  Overall air entry has improved Heart: regular rate and rhythm, S1, S2 normal and soft systolic murmur noted Abdomen: soft, non-tender; bowel sounds normal; no masses,  no organomegaly Extremities: extremities normal, atraumatic, no cyanosis or edema  Lab Results:  Recent Labs  05/09/16 0400 05/10/16 0445  WBC 7.0 8.1  HGB 10.9* 10.7*  PLT 104* 124*    Recent Labs  05/09/16 2234 05/10/16 0445  NA 144 143  K 3.8 3.3*  CL 103 100*  CO2 31 30  GLUCOSE 129* 122*  BUN 32* 30*  CREATININE 1.49* 1.45*    Recent Labs  05/07/16 2340 05/09/16 0529  TROPONINI 2.57* 1.46*   Hepatic Function Panel No results for input(s): PROT, ALBUMIN, AST, ALT, ALKPHOS, BILITOT, BILIDIR, IBILI in the last 72 hours. No results for input(s): CHOL in the last 72 hours. No results for input(s): PROTIME in the last 72 hours.  Imaging: Imaging results have been reviewed and [name] 1 View  05/10/2016  CLINICAL DATA:  Acute  respiratory failure.  Shortness of breath. EXAM: PORTABLE CHEST 1 VIEW COMPARISON:  05/09/2016. FINDINGS: Endotracheal tube and NG tube have been removed. Right IJ line noted with tip projected over the right atrium. Stable cardiomegaly. Diffuse bilateral pulmonary alveolar infiltrates and/or edema have progressed. No interim change. No pleural effusion or pneumothorax . IMPRESSION: 1. Interim extubation removal of NG tube. Right IJ line stable position. 2. Interim progression of bilateral pulmonary infiltrates and/or edema. 3. Cardiomegaly. Electronically Signed   By: Marcello Moores  Register   On: 05/10/2016 07:40   [name] 1 View  05/09/2016  CLINICAL DATA:  Acute respiratory failure. Shortness of breath. Hypertension. EXAM: PORTABLE CHEST 1 VIEW COMPARISON:  05/08/2016 FINDINGS: Endotracheal tube with tip measuring 4.6 cm above the carina. Enteric tube tip is off the field of view but below the left hemidiaphragm. Right central venous catheter projects over the cavoatrial junction. No pneumothorax. Heart size and pulmonary vascularity are normal. Bilateral perihilar airspace disease likely representing edema although could be pneumonia. This appears to be slightly improved since previous study. Calcified and tortuous aorta IMPRESSION: Appliances appear in satisfactory location. Diffuse bilateral perihilar infiltration, likely edema, mild improvement since previous study Electronically Signed   By: Lucienne Capers M.D.   On: 05/09/2016 04:02    Cardiac Studies:  Assessment/Plan:  Status post Acute small non-Q-wave myocardial infarction complicated by acute pulmonary edema Status post sustained VT  Status post nferoposterior wall acute injury pattern in the setting of marked sinus tachycardia/VT status post left cath and attempted PCI to left circumflex and RCA. Ischemic dilated cardiomyopathy  Multivessel CAD Hyperlipidemia New-onset diabetes mellitus Status postAcute respiratory failure secondary  to above/possible bilateral pneumonia Status post hypertensive emergency Tobacco abuse ResolvingAcute kidney injury secondary to hypotension/contrast  hypokalemia PLAN Reduce Lasix dose to 40 mg twice daily. Start entresto 24/26 mg twice daily. DC BiDil. Start and/or 30 mg daily We will up titrate beta blockers as tolerated. Change IV amiodarone to by mouth Will get cardiac MRI to evaluate for viability once more stable. Replace K Check labs in a.m.   LOS: 5 days    [name] 05/10/2016, 10:54 AM

## 2016-05-10 NOTE — Progress Notes (Signed)
Right side much weaker than left. Pt uses his left hand predominantly. Pt stated that he has been this way for a while, this is not new.

## 2016-05-10 NOTE — Progress Notes (Signed)
Pharmacy Antibiotic Note  Gregory Dyer is a 70 y.o. male admitted on 05/05/2016 with cardiac issues.  Pharmacy has been consulted for Vancomycin/Zosyn dosing for possible pneumonia. WBC WNL, afebrile. All cultures negative. Per Dr. Vaughan Browner, continue antibiotic treatment for a total 7 days for possible HCAP.   Plan: Discontinue Vancomycin Zosyn 3.375G IV q8h to stop tomorrow   Height: 5\' 8"  (172.7 cm) Weight: 188 lb 15 oz (85.7 kg) IBW/kg (Calculated) : 68.4  Temp (24hrs), Avg:98.3 F (36.8 C), Min:97.7 F (36.5 C), Max:98.7 F (37.1 C)   Recent Labs Lab 05/05/16 1612 05/05/16 1953  05/06/16 2000 05/07/16 0428  05/08/16 0420 05/09/16 0400 05/09/16 0529 05/09/16 2115 05/09/16 2234 05/10/16 0445  WBC  --   --   < > 11.1* 7.8  --  9.0 7.0  --   --   --  8.1  CREATININE  --   --   < >  --  1.21  < > 1.68*  --  1.65* 1.51* 1.49* 1.45*  LATICACIDVEN 10.30* 1.6  --   --  1.3  --   --   --   --   --   --   --   < > = values in this interval not displayed.  Estimated Creatinine Clearance: 50.5 mL/min (by C-G formula based on Cr of 1.45).    Allergies  Allergen Reactions  . No Known Allergies     Joya San, PharmD Clinical Pharmacy Resident Pager # [phone] 05/10/2016 11:20 AM

## 2016-05-11 ENCOUNTER — Inpatient Hospital Stay (HOSPITAL_COMMUNITY): Payer: Medicare Other

## 2016-05-11 DIAGNOSIS — R739 Hyperglycemia, unspecified: Secondary | ICD-10-CM

## 2016-05-11 DIAGNOSIS — J189 Pneumonia, unspecified organism: Secondary | ICD-10-CM

## 2016-05-11 DIAGNOSIS — R778 Other specified abnormalities of plasma proteins: Secondary | ICD-10-CM | POA: Diagnosis present

## 2016-05-11 DIAGNOSIS — G9340 Encephalopathy, unspecified: Secondary | ICD-10-CM | POA: Diagnosis present

## 2016-05-11 DIAGNOSIS — A419 Sepsis, unspecified organism: Secondary | ICD-10-CM

## 2016-05-11 DIAGNOSIS — R7989 Other specified abnormal findings of blood chemistry: Secondary | ICD-10-CM

## 2016-05-11 DIAGNOSIS — I214 Non-ST elevation (NSTEMI) myocardial infarction: Secondary | ICD-10-CM | POA: Diagnosis present

## 2016-05-11 DIAGNOSIS — I161 Hypertensive emergency: Secondary | ICD-10-CM | POA: Diagnosis present

## 2016-05-11 LAB — GLUCOSE, CAPILLARY
GLUCOSE-CAPILLARY: 240 mg/dL — AB (ref 65–99)
GLUCOSE-CAPILLARY: 192 mg/dL — AB (ref 65–99)
Glucose-Capillary: 122 mg/dL — ABNORMAL HIGH (ref 65–99)
Glucose-Capillary: 194 mg/dL — ABNORMAL HIGH (ref 65–99)

## 2016-05-11 LAB — BASIC METABOLIC PANEL
Anion gap: 13 (ref 5–15)
BUN: 24 mg/dL — AB (ref 6–20)
CALCIUM: 8.1 mg/dL — AB (ref 8.9–10.3)
CO2: 27 mmol/L (ref 22–32)
CREATININE: 1.43 mg/dL — AB (ref 0.61–1.24)
Chloride: 100 mmol/L — ABNORMAL LOW (ref 101–111)
GFR calc Af Amer: 56 mL/min — ABNORMAL LOW (ref >60)
GFR, EST NON AFRICAN AMERICAN: 48 mL/min — AB (ref >60)
GLUCOSE: 127 mg/dL — AB (ref 65–99)
Potassium: 3.5 mmol/L (ref 3.5–5.1)
Sodium: 140 mmol/L (ref 135–145)

## 2016-05-11 LAB — BRAIN NATRIURETIC PEPTIDE: B Natriuretic Peptide: 787.1 pg/mL — ABNORMAL HIGH (ref 0.0–100.0)

## 2016-05-11 LAB — CBC
HCT: 34 % — ABNORMAL LOW (ref 39.0–52.0)
Hemoglobin: 11.2 g/dL — ABNORMAL LOW (ref 13.0–17.0)
MCH: 32.2 pg (ref 26.0–34.0)
MCHC: 32.9 g/dL (ref 30.0–36.0)
MCV: 97.7 fL (ref 78.0–100.0)
PLATELETS: 137 10*3/uL — AB (ref 150–400)
RBC: 3.48 MIL/uL — ABNORMAL LOW (ref 4.22–5.81)
RDW: 13.4 % (ref 11.5–15.5)
WBC: 7.6 10*3/uL (ref 4.0–10.5)

## 2016-05-11 LAB — PROCALCITONIN: Procalcitonin: 0.2 ng/mL

## 2016-05-11 LAB — MAGNESIUM: Magnesium: 1.9 mg/dL (ref 1.7–2.4)

## 2016-05-11 LAB — PHOSPHORUS: Phosphorus: 2.6 mg/dL (ref 2.5–4.6)

## 2016-05-11 MED ORDER — POTASSIUM CHLORIDE 10 MEQ/50ML IV SOLN
10.0000 meq | INTRAVENOUS | Status: AC
  Administered 2016-05-11 (×4): 10 meq via INTRAVENOUS
  Filled 2016-05-11 (×4): qty 50

## 2016-05-11 NOTE — Progress Notes (Signed)
2nd Attempt to call report to 2W

## 2016-05-11 NOTE — Progress Notes (Signed)
[facility] ADULT ICU REPLACEMENT PROTOCOL FOR AM LAB REPLACEMENT ONLY  The patient does not apply for the [facility] Adult ICU Electrolyte Replacment Protocol based on the criteria listed below:    Abnormal electrolyte(s): K3.5   If a panic level lab has been reported, has the CCM MD in charge been notified? Yes.  .   Physician:  Luevenia Maxin, MD  [facility]ear Clock 05/11/2016 6:01 AM

## 2016-05-11 NOTE — Progress Notes (Signed)
Subjective:  Patient denies any chest pain or shortness of breath. Reviewed angiogram done few days ago patient has collaterals from LAD to distal RCA, will get CVTS consult first before getting cardiac MRI to check for viability.  Objective:  Vital Signs in the last 24 hours: Temp:  [98.2 F (36.8 C)-98.6 F (37 C)] 98.2 F (36.8 C) (05/18 0805) Pulse Rate:  [65-79] 76 (05/18 0839) Resp:  [20-27] 20 (05/18 0800) BP: (92-122)/(46-85) 122/62 mmHg (05/18 0839) SpO2:  [93 %-99 %] 98 % (05/18 0800)  Intake/Output from previous day: 05/17 0701 - 05/18 0700 In: 841.5 [P.O.:600; I.V.:41.5; IV Piggyback:200] Out: 2550 [Urine:2550] Intake/Output from this shift: Total I/O In: 220 [P.O.:120; IV Piggyback:100] Out: -   Physical Exam: Neck: no adenopathy, no carotid bruit, no JVD and supple, symmetrical, trachea midline Lungs: Decreased breath sound at bases with faint rales Heart: regular rate and rhythm, S1, S2 normal and Soft systolic murmur and S3 gallop noted Abdomen: soft, non-tender; bowel sounds normal; no masses,  no organomegaly Extremities: extremities normal, atraumatic, no cyanosis or edema  Lab Results:  Recent Labs  05/10/16 0445 05/11/16 0420  WBC 8.1 7.6  HGB 10.7* 11.2*  PLT 124* 137*    Recent Labs  05/10/16 0445 05/11/16 0420  NA 143 140  K 3.3* 3.5  CL 100* 100*  CO2 30 27  GLUCOSE 122* 127*  BUN 30* 24*  CREATININE 1.45* 1.43*    Recent Labs  05/09/16 0529  TROPONINI 1.46*   Hepatic Function Panel No results for input(s): PROT, ALBUMIN, AST, ALT, ALKPHOS, BILITOT, BILIDIR, IBILI in the last 72 hours. No results for input(s): CHOL in the last 72 hours. No results for input(s): PROTIME in the last 72 hours.  Imaging: Imaging results have been reviewed and [name] 1 View  05/11/2016  CLINICAL DATA:  Acute respiratory failure, community-acquired pneumonia, current smoker. EXAM: PORTABLE CHEST 1 VIEW COMPARISON:  Portable chest x-ray of May 10, 2016 and May 05, 2016. FINDINGS: The lungs are well-expanded. There is no alveolar infiltrate. The interstitial markings remain diffusely increased but are slightly less conspicuous today. The cardiac silhouette remains enlarged. The pulmonary vascularity remains engorged and indistinct. The mediastinum is widened but stable. The right internal jugular venous catheter tip projects over the cavoatrial junction. IMPRESSION: Slight interval decrease in pulmonary interstitial edema. Stable cardiomegaly. Stable prominence of the mediastinum. Electronically Signed   By: David  Martinique M.D.   On: 05/11/2016 07:20   [name] 1 View  05/10/2016  CLINICAL DATA:  Acute respiratory failure.  Shortness of breath. EXAM: PORTABLE CHEST 1 VIEW COMPARISON:  05/09/2016. FINDINGS: Endotracheal tube and NG tube have been removed. Right IJ line noted with tip projected over the right atrium. Stable cardiomegaly. Diffuse bilateral pulmonary alveolar infiltrates and/or edema have progressed. No interim change. No pleural effusion or pneumothorax . IMPRESSION: 1. Interim extubation removal of NG tube. Right IJ line stable position. 2. Interim progression of bilateral pulmonary infiltrates and/or edema. 3. Cardiomegaly. Electronically Signed   By: Marcello Moores  Register   On: 05/10/2016 07:40    Cardiac Studies:  Assessment/Plan:  Status post Acute small non-Q-wave myocardial infarction complicated by acute pulmonary edema Status post sustained VT  Status post Inferoposterior wall acute injury pattern in the setting of marked sinus tachycardia/VT status post left cath and attempted PCI to left circumflex and RCA. Ischemic dilated cardiomyopathy Multivessel CAD Hyperlipidemia New-onset diabetes mellitus Status postAcute respiratory failure secondary to above/possible bilateral pneumonia Status post  hypertensive emergency Tobacco abuse ResolvingAcute kidney injury secondary to hypotension/contrast   Hypokalemia Plan Continue present management Will get CVTS consult before considering further workup. May need ICD in future if EF remains below 35%.  LOS: 6 days    [name] 05/11/2016, 9:35 AM

## 2016-05-11 NOTE — Progress Notes (Signed)
PROGRESS NOTE    Gregory Dyer  [id number] DOB: 10/19/1945 DOA: 05/05/2016 PCP: No primary care provider on file.   Brief Narrative:  70 yo WM PMHx HTN  Developed productive cough 3 days prior to admission. Over 24 hours prior to admission he had sudden onset of dyspnea that became progressively worse. He was also having diaphoresis. In ER he had SpO2 in 50's. He required intubation. He had BP 196/113 on arrival to ER, and temperature of 95.9. WBC and Lactic acid elevated. CXR showed diffuse, patchy ASD concerning for pneumonia. There was concern for NSTEMI >> Dr. Terrence Dupont was consulted by EDP, and recommend to start heparin gtt. He was started on antibiotics in ER for community acquired pneumonia.   Assessment & Plan:   Active Problems:   CAP (community acquired pneumonia)   Acute respiratory failure with hypoxia (HCC)   Elevated troponin   Hypertensive emergency   NSTEMI (non-ST elevated myocardial infarction) ([place])   Sepsis due to pneumonia ([place])   Acute encephalopathy   Hyperglycemia  Acute hypoxic/hypercapnic respiratory failure/pulmonary edema/ Sepsis CAP - Most likely multifactorial to include CAP, NSTEMI cardiac arrest.  -Patient has completed 7 day course of multiple antibiotics -Asymptomatic  Tobacco abuse.    HTN emergency. -Amiodarone 200 mg daily -Coreg 3.125 mg BID -Lasix 40 mg BID -Imdur 30 mg daily -Ernesto 24-26 mg 1 tab BID  NSTEMI likely from demand ischemia. -See HTN  Acute systolic CHF  -See HTN  -Strict in and out -Daily weight Filed Weights   05/07/16 0500 05/08/16 0427 05/09/16 0300  Weight: 85.7 kg (188 lb 15 oz) 86.1 kg (189 lb 13.1 oz) 85.7 kg (188 lb 15 oz)   -S/P cardiac cath after VT. EF appeared to be 15-20% with global hypokinesis; see report below -Possible CABG  Acute renal failure Lab Results  Component Value Date   CREATININE 1.43* 05/11/2016   CREATININE 1.45* 05/10/2016   CREATININE 1.49* 05/09/2016    -Improving slightly   Hyperglycemia - no reported hx of DM. -04/1310 A1c= 5.5 -Resistant SSI  Acute encephalopathy  -Most likely multifactorial 2nd to respiratory failure, sepsis/pneumonia -UDS negative Resolved except for mild short-term confusion (thought he was going to have CABG in A.m).    DVT prophylaxis: Subcutaneous heparin Code Status: Full Family Communication: None available Disposition Plan: Workup for CABG   Consultants:  Dr.Mohan Harwani, cardiology Dr.Praveen Mannam [facility]   Procedures/Significant Events:  CXR 5/12 > B/L opacities. Images reviewed 5/12 Admit, cardiology consulted, start heparin gtt. 5/12 ETT >>  5/13 echocardiogram;- LVEFf 35% to 40%. Severe hypokinesis of the basal-midinferior  myocardium. - Mitral valve:moderate regurgitation. 05/06/16 - failed SBT.  5/14- Cardiac arrest with VT, urgent cath.  5/15 left heart cath; LVEF=15-20% with global hypokinesis. -LVEDP of 43 mmHg. 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 No distal target vessel was visualized.-Faint collaterals to the RCA, most of the collaterals From LAD septal perforators. 3. Right coronary artery appears to be superdominant. It is occluded in the proximal segment. 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 There were bridging collaterals and also contralateral collaterals.   Cultures 5/12 Blood >> 5/12 Sputum >> 5/12 Pneumococcal Ag >>neg 5/12 Legionella Ag >>neg 5/12 Respiratory viral panel >> Neg 5/12 - urine culture - negatiove  Antimicrobials: 5/12 Zithromax >>5/13 5/12 Rocephin >> 5/13 Doxycycline 5/14>> 2 doses Zosyn 5/15 >> 5/18 Vanco 5/15 >> 5/17   Devices    LINES /  TUBES:      Continuous Infusions: . norepinephrine (LEVOPHED) Adult infusion       Subjective: 5/18 A/O 4, NAD. States was out buying a truck when he developed CP. Girlfriend brought him to the ED. Distant past  fell off 100 foot ladder which resulted in right hemiparesis.   Objective: Filed Vitals:   05/11/16 1622 05/11/16 1637 05/11/16 1700 05/11/16 1900  BP:  106/52    Pulse:  72 75 69  Temp: 97.2 F (36.2 C)     TempSrc: Oral     Resp:   23 22  Height:      Weight:      SpO2:   99% 95%    Intake/Output Summary (Last 24 hours) at 05/11/16 2025 Last data filed at 05/11/16 2025  Gross per 24 hour  Intake    930 ml  Output   1400 ml  Net   -470 ml   Filed Weights   05/07/16 0500 05/08/16 0427 05/09/16 0300  Weight: 85.7 kg (188 lb 15 oz) 86.1 kg (189 lb 13.1 oz) 85.7 kg (188 lb 15 oz)    Examination:  General: A/O 4, NAD., No acute respiratory distress Eyes: negative scleral hemorrhage, negative anisocoria, negative icterus ENT: Negative Runny nose, negative gingival bleeding, Neck:  Negative scars, masses, torticollis, lymphadenopathy, JVD Lungs: Clear to auscultation bilaterally without wheezes or crackles Cardiovascular: Regular rate and rhythm without murmur gallop or rub normal S1 and S2 Abdomen: negative abdominal pain, nondistended, positive soft, bowel sounds, no rebound, no ascites, no appreciable mass Extremities: No significant cyanosis, clubbing, or edema bilateral lower extremities Skin: Negative rashes, lesions, ulcers Psychiatric:  Negative depression, negative anxiety, negative fatigue, negative mania  Central nervous system:  Cranial nerves II through XII intact, tongue/uvula midline, right hemiparesis, LUE/LLE  extremities muscle strength 5/5, sensation intact throughout, positive mild dysarthria (chronic), negative expressive aphasia, negative receptive aphasia.  .     Data Reviewed: Care during the described time interval was provided by me .  I have reviewed this patient's available data, including medical history, events of note, physical examination, and all test results as part of my evaluation. I have personally reviewed and interpreted all radiology  studies.  CBC:  Recent Labs Lab 05/05/16 1545  05/07/16 0428 05/08/16 0420 05/09/16 0400 05/10/16 0445 05/11/16 0420  WBC 20.6*  < > 7.8 9.0 7.0 8.1 7.6  NEUTROABS 7.2  --   --   --   --   --   --   HGB 17.5*  < > 12.9* 12.5* 10.9* 10.7* 11.2*  HCT 52.7*  < > 37.9* 37.8* 32.1* 33.3* 34.0*  MCV 100.8*  < > 95.9 97.4 97.0 99.7 97.7  PLT 187  < > 134* 137* 104* 124* 137*  < > = values in this interval not displayed. Basic Metabolic Panel:  Recent Labs Lab 05/09/16 0529 05/09/16 1230 05/09/16 2115 05/09/16 2234 05/10/16 0445 05/11/16 0420  NA 145  --  136 144 143 140  K 2.8*  --  3.5 3.8 3.3* 3.5  CL 105  --  92* 103 100* 100*  CO2 26  --  27 31 30 27   GLUCOSE 185*  --  465* 129* 122* 127*  BUN 41*  --  31* 32* 30* 24*  CREATININE 1.65*  --  1.51* 1.49* 1.45* 1.43*  CALCIUM 7.7*  --  7.2* 8.0* 7.6* 8.1*  MG 1.9 1.9 2.0  --  2.1 1.9  PHOS 1.1* 3.6 20.7*  --  4.0 2.6   GFR: Estimated Creatinine Clearance: 51.2 mL/min (by C-G formula based on Cr of 1.43). Liver Function Tests:  Recent Labs Lab 05/05/16 1545 05/06/16 0500  AST 58* 34  ALT 17 17  ALKPHOS 78 52  BILITOT 1.1 0.8  PROT 8.0 5.3*  ALBUMIN 4.4 2.9*   No results for input(s): LIPASE, AMYLASE in the last 168 hours. No results for input(s): AMMONIA in the last 168 hours. Coagulation Profile:  Recent Labs Lab 05/05/16 1545  INR 1.06   Cardiac Enzymes:  Recent Labs Lab 05/06/16 1700 05/06/16 2303 05/07/16 0428 05/07/16 2340 05/08/16 0420 05/09/16 0529  CKTOTAL  --   --   --   --  84  --   CKMB  --   --   --   --  2.6  --   TROPONINI 5.93* 5.03* 5.02* 2.57*  --  1.46*   BNP (last 3 results) No results for input(s): PROBNP in the last 8760 hours. HbA1C: No results for input(s): HGBA1C in the last 72 hours. CBG:  Recent Labs Lab 05/10/16 1607 05/10/16 2137 05/11/16 0805 05/11/16 1112 05/11/16 1620  GLUCAP 160* 111* 122* 240* 194*   Lipid Profile: No results for input(s): CHOL,  HDL, LDLCALC, TRIG, CHOLHDL, LDLDIRECT in the last 72 hours. Thyroid Function Tests: No results for input(s): TSH, T4TOTAL, FREET4, T3FREE, THYROIDAB in the last 72 hours. Anemia Panel: No results for input(s): VITAMINB12, FOLATE, FERRITIN, TIBC, IRON, RETICCTPCT in the last 72 hours. Urine analysis:    Component Value Date/Time   COLORURINE YELLOW 05/05/2016 1641   APPEARANCEUR CLOUDY* 05/05/2016 1641   LABSPEC 1.028 05/05/2016 1641   PHURINE 5.5 05/05/2016 1641   GLUCOSEU 250* 05/05/2016 1641   HGBUR MODERATE* 05/05/2016 1641   BILIRUBINUR NEGATIVE 05/05/2016 1641   KETONESUR NEGATIVE 05/05/2016 1641   PROTEINUR >300* 05/05/2016 1641   NITRITE NEGATIVE 05/05/2016 1641   LEUKOCYTESUR NEGATIVE 05/05/2016 1641   Sepsis Labs: @LABRCNTIP (procalcitonin:4,lacticidven:4)  ) Recent Results (from the past 240 hour(s))  Blood Culture (routine x 2)     Status: None   Collection Time: 05/05/16  3:45 PM  Result Value Ref Range Status   Specimen Description BLOOD LEFT ARM  Final   Special Requests BOTTLES DRAWN AEROBIC AND ANAEROBIC 5CC  Final   Culture   Final    NO GROWTH 5 DAYS Performed at [facility]    Report Status 05/10/2016 FINAL  Final  Blood Culture (routine x 2)     Status: None   Collection Time: 05/05/16  4:01 PM  Result Value Ref Range Status   Specimen Description BLOOD LEFT HAND  Final   Special Requests BOTTLES DRAWN AEROBIC ONLY [place]  Final   Culture   Final    NO GROWTH 5 DAYS Performed at [facility]    Report Status 05/10/2016 FINAL  Final  Urine culture     Status: None   Collection Time: 05/05/16  4:41 PM  Result Value Ref Range Status   Specimen Description URINE, CLEAN CATCH  Final   Special Requests NONE  Final   Culture NO GROWTH Performed at [facility]   Final   Report Status 05/06/2016 FINAL  Final  MRSA PCR Screening     Status: None   Collection Time: 05/05/16  7:50 PM  Result Value Ref Range Status   MRSA by PCR  NEGATIVE NEGATIVE Final    Comment:        The GeneXpert MRSA Assay (FDA  approved for NASAL specimens only), is one component of a comprehensive MRSA colonization surveillance program. It is not intended to diagnose MRSA infection nor to guide or monitor treatment for MRSA infections.   Respiratory Panel by PCR     Status: None   Collection Time: 05/05/16  8:38 PM  Result Value Ref Range Status   Adenovirus NOT DETECTED NOT DETECTED Final   Coronavirus 229E NOT DETECTED NOT DETECTED Final   Coronavirus HKU1 NOT DETECTED NOT DETECTED Final   Coronavirus NL63 NOT DETECTED NOT DETECTED Final   Coronavirus OC43 NOT DETECTED NOT DETECTED Final   Metapneumovirus NOT DETECTED NOT DETECTED Final   Rhinovirus / Enterovirus NOT DETECTED NOT DETECTED Final   Influenza A NOT DETECTED NOT DETECTED Final   Influenza A H1 NOT DETECTED NOT DETECTED Final   Influenza A H1 2009 NOT DETECTED NOT DETECTED Final   Influenza A H3 NOT DETECTED NOT DETECTED Final   Influenza B NOT DETECTED NOT DETECTED Final   Parainfluenza Virus 1 NOT DETECTED NOT DETECTED Final   Parainfluenza Virus 2 NOT DETECTED NOT DETECTED Final   Parainfluenza Virus 3 NOT DETECTED NOT DETECTED Final   Parainfluenza Virus 4 NOT DETECTED NOT DETECTED Final   Respiratory Syncytial Virus NOT DETECTED NOT DETECTED Final   Bordetella pertussis NOT DETECTED NOT DETECTED Final   Chlamydophila pneumoniae NOT DETECTED NOT DETECTED Final   Mycoplasma pneumoniae NOT DETECTED NOT DETECTED Final    Comment: Performed at [facility]  Culture, respiratory (NON-Expectorated)     Status: None   Collection Time: 05/06/16  9:04 AM  Result Value Ref Range Status   Specimen Description ENDOTRACHEAL  Final   Special Requests NONE  Final   Gram Stain   Final    ABUNDANT WBC PRESENT, PREDOMINANTLY PMN RARE SQUAMOUS EPITHELIAL CELLS PRESENT RARE GRAM POSITIVE COCCI IN PAIRS RARE GRAM POSITIVE RODS Performed at [facility]     Culture   Final    NORMAL OROPHARYNGEAL FLORA Performed at [facility]    Report Status 05/09/2016 FINAL  Final         Radiology Studies: [name] 1 View  05/11/2016  CLINICAL DATA:  Acute respiratory failure, community-acquired pneumonia, current smoker. EXAM: PORTABLE CHEST 1 VIEW COMPARISON:  Portable chest x-ray of May 10, 2016 and May 05, 2016. FINDINGS: The lungs are well-expanded. There is no alveolar infiltrate. The interstitial markings remain diffusely increased but are slightly less conspicuous today. The cardiac silhouette remains enlarged. The pulmonary vascularity remains engorged and indistinct. The mediastinum is widened but stable. The right internal jugular venous catheter tip projects over the cavoatrial junction. IMPRESSION: Slight interval decrease in pulmonary interstitial edema. Stable cardiomegaly. Stable prominence of the mediastinum. Electronically Signed   By: David  Martinique M.D.   On: 05/11/2016 07:20   [name] 1 View  05/10/2016  CLINICAL DATA:  Acute respiratory failure.  Shortness of breath. EXAM: PORTABLE CHEST 1 VIEW COMPARISON:  05/09/2016. FINDINGS: Endotracheal tube and NG tube have been removed. Right IJ line noted with tip projected over the right atrium. Stable cardiomegaly. Diffuse bilateral pulmonary alveolar infiltrates and/or edema have progressed. No interim change. No pleural effusion or pneumothorax . IMPRESSION: 1. Interim extubation removal of NG tube. Right IJ line stable position. 2. Interim progression of bilateral pulmonary infiltrates and/or edema. 3. Cardiomegaly. Electronically Signed   By: Marcello Moores  Register   On: 05/10/2016 07:40        Scheduled Meds: . amiodarone  200 mg Oral Daily  . aspirin  81 mg Oral Daily  . atorvastatin  80 mg Oral q1800  . carvedilol  3.125 mg Oral BID WC  . clopidogrel  75 mg Oral Q breakfast  . feeding supplement (ENSURE ENLIVE)  237 mL Oral BID BM  . furosemide  40 mg Intravenous BID    . heparin subcutaneous  5,000 Units Subcutaneous Q8H  . insulin aspart  0-20 Units Subcutaneous TID WC & HS  . ipratropium-albuterol  3 mL Nebulization Q4H  . isosorbide mononitrate  30 mg Oral Daily  . ondansetron (ZOFRAN) IV  8 mg Intravenous Once  . sacubitril-valsartan  1 tablet Oral BID  . sodium bicarbonate  100 mEq Intravenous Once   Continuous Infusions: . norepinephrine (LEVOPHED) Adult infusion       LOS: 6 days    Time spent: 40 minutes    [place], Geraldo Docker, MD [facility] Pager [phone]   If 7PM-7AM, please contact night-coverage [email or web address] Password [email or web address] 05/11/2016, 8:25 PM

## 2016-05-11 NOTE — Progress Notes (Signed)
Attempted to call report to 2W 

## 2016-05-12 ENCOUNTER — Encounter (HOSPITAL_COMMUNITY): Payer: Self-pay | Admitting: Thoracic Surgery (Cardiothoracic Vascular Surgery)

## 2016-05-12 DIAGNOSIS — Z720 Tobacco use: Secondary | ICD-10-CM | POA: Diagnosis present

## 2016-05-12 DIAGNOSIS — I2510 Atherosclerotic heart disease of native coronary artery without angina pectoris: Secondary | ICD-10-CM

## 2016-05-12 DIAGNOSIS — I214 Non-ST elevation (NSTEMI) myocardial infarction: Secondary | ICD-10-CM

## 2016-05-12 LAB — CBC
HEMATOCRIT: 35.2 % — AB (ref 39.0–52.0)
HEMOGLOBIN: 11.8 g/dL — AB (ref 13.0–17.0)
MCH: 32.2 pg (ref 26.0–34.0)
MCHC: 33.5 g/dL (ref 30.0–36.0)
MCV: 95.9 fL (ref 78.0–100.0)
Platelets: 158 10*3/uL (ref 150–400)
RBC: 3.67 MIL/uL — ABNORMAL LOW (ref 4.22–5.81)
RDW: 13 % (ref 11.5–15.5)
WBC: 7.4 10*3/uL (ref 4.0–10.5)

## 2016-05-12 LAB — GLUCOSE, CAPILLARY
Glucose-Capillary: 124 mg/dL — ABNORMAL HIGH (ref 65–99)
Glucose-Capillary: 163 mg/dL — ABNORMAL HIGH (ref 65–99)

## 2016-05-12 MED ORDER — CARVEDILOL 3.125 MG PO TABS: 3.1250 mg | ORAL_TABLET | Freq: Two times a day (BID) | ORAL | Status: DC

## 2016-05-12 MED ORDER — SPIRONOLACTONE 25 MG PO TABS
12.5000 mg | ORAL_TABLET | Freq: Every day | ORAL | Status: DC
  Administered 2016-05-12: 12.5 mg via ORAL
  Filled 2016-05-12: qty 1

## 2016-05-12 MED ORDER — SPIRONOLACTONE 25 MG PO TABS: 12.5000 mg | ORAL_TABLET | Freq: Every day | ORAL | Status: DC

## 2016-05-12 MED ORDER — AMIODARONE HCL 200 MG PO TABS: 200.0000 mg | ORAL_TABLET | Freq: Every day | ORAL | Status: DC

## 2016-05-12 MED ORDER — ALBUTEROL SULFATE HFA 108 (90 BASE) MCG/ACT IN AERS: 1.0000 | INHALATION_SPRAY | Freq: Two times a day (BID) | RESPIRATORY_TRACT | Status: DC | PRN

## 2016-05-12 MED ORDER — ATORVASTATIN CALCIUM 80 MG PO TABS: 80.0000 mg | ORAL_TABLET | Freq: Every day | ORAL | Status: DC

## 2016-05-12 MED ORDER — ASPIRIN 81 MG PO CHEW: 81.0000 mg | CHEWABLE_TABLET | Freq: Every day | ORAL | Status: DC

## 2016-05-12 MED ORDER — SACUBITRIL-VALSARTAN 24-26 MG PO TABS: 1.0000 | ORAL_TABLET | Freq: Two times a day (BID) | ORAL | Status: DC

## 2016-05-12 MED ORDER — CLOPIDOGREL BISULFATE 75 MG PO TABS: 75.0000 mg | ORAL_TABLET | Freq: Every day | ORAL | Status: DC

## 2016-05-12 MED ORDER — FUROSEMIDE 40 MG PO TABS
40.0000 mg | ORAL_TABLET | Freq: Every day | ORAL | Status: DC
  Administered 2016-05-12: 40 mg via ORAL
  Filled 2016-05-12: qty 1

## 2016-05-12 MED ORDER — IPRATROPIUM-ALBUTEROL 0.5-2.5 (3) MG/3ML IN SOLN
3.0000 mL | Freq: Three times a day (TID) | RESPIRATORY_TRACT | Status: DC
  Filled 2016-05-12: qty 3

## 2016-05-12 MED ORDER — ISOSORBIDE MONONITRATE ER 30 MG PO TB24: 30.0000 mg | ORAL_TABLET | Freq: Every day | ORAL | Status: DC

## 2016-05-12 MED ORDER — ALBUTEROL SULFATE (2.5 MG/3ML) 0.083% IN NEBU
2.5000 mg | INHALATION_SOLUTION | RESPIRATORY_TRACT | Status: DC | PRN
Start: 2016-05-12 — End: 2016-05-12

## 2016-05-12 MED ORDER — FUROSEMIDE 40 MG PO TABS: 40.0000 mg | ORAL_TABLET | Freq: Every day | ORAL | Status: DC

## 2016-05-12 NOTE — Progress Notes (Signed)
  Amiodarone Drug - Drug Interaction Consult Note  Recommendations: -Watch for complains of muscle pain/weakness d/t atorvastatin -watch BP and HR with Coreg -watch K closely and supplement when needed to avoid hypokalemia in setting of Lasix use  Amiodarone is metabolized by the cytochrome P450 system and therefore has the potential to cause many drug interactions. Amiodarone has an average plasma half-life of 50 days (range 20 to 100 days).   There is potential for drug interactions to occur several weeks or months after stopping treatment and the onset of drug interactions may be slow after initiating amiodarone.   [x]  Statins: Increased risk of myopathy. Simvastatin- restrict dose to 20mg  daily. Other statins: counsel patients to report any muscle pain or weakness immediately.  []  Anticoagulants: Amiodarone can increase anticoagulant effect. Consider warfarin dose reduction. Patients should be monitored closely and the dose of anticoagulant altered accordingly, remembering that amiodarone levels take several weeks to stabilize.  []  Antiepileptics: Amiodarone can increase plasma concentration of phenytoin, the dose should be reduced. Note that small changes in phenytoin dose can result in large changes in levels. Monitor patient and counsel on signs of toxicity.  [x]  Beta blockers: increased risk of bradycardia, AV block and myocardial depression. Sotalol - avoid concomitant use.  []   Calcium channel blockers (diltiazem and verapamil): increased risk of bradycardia, AV block and myocardial depression.  []   Cyclosporine: Amiodarone increases levels of cyclosporine. Reduced dose of cyclosporine is recommended.  []  Digoxin dose should be halved when amiodarone is started.  [x]  Diuretics: increased risk of cardiotoxicity if hypokalemia occurs.  []  Oral hypoglycemic agents (glyburide, glipizide, glimepiride): increased risk of hypoglycemia. Patient's glucose levels should be monitored  closely when initiating amiodarone therapy.   []  Drugs that prolong the QT interval:  Torsades de pointes risk may be increased with concurrent use - avoid if possible.  Monitor QTc, also keep magnesium/potassium WNL if concurrent therapy can't be avoided. Marland Kitchen Antibiotics: e.g. fluoroquinolones, erythromycin. . Antiarrhythmics: e.g. quinidine, procainamide, disopyramide, sotalol. . Antipsychotics: e.g. phenothiazines, haloperidol.  . Lithium, tricyclic antidepressants, and methadone.  Sheritta Deeg D. Karlene Southard, PharmD, BCPS Clinical Pharmacist Pager: [phone] 05/12/2016 2:25 PM

## 2016-05-12 NOTE — Care Management Note (Signed)
Case Management Note Marvetta Gibbons RN, BSN Unit 2W-Case Manager [phone]  Patient Details  Name: Gregory Dyer MRN: [id number] Date of Birth: 10/19/1945  Subjective/Objective:  Pt admitted with CAP                  Action/Plan: PTA pt lived at home with SO, per conversation with pt and SO pt is not able to get part D yet under Medicare or any type of supplement plan until Oct of this year due to pt's birth certificate being change by his mother when he was young and his listed birth year is wrong. Call made to [place] [facility] where pt use to get medications and they have a pt listed with birth date [birth year] which is the other date of pt- he is listed as no part D under that date also. SO also states that she has had trouble getting into a PCP office due to the mix up with birth dates. List of clinics who see uninsured pts given to pt/SO and pt also given [place] letter to assist with medications on discharge. SO to assist pt with arranging f/u with one of the clinics and seeing about getting an orange card with the clinic. Also plans to go to DSS to see about filing for disability.   Expected Discharge Date:  05/12/16               Expected Discharge Plan:  Home/Self Care  In-House Referral:     Discharge planning Services  CM Consult, [facility], [facility], Medication Assistance  Post Acute Care Choice:    Choice offered to:     DME Arranged:    DME Agency:     HH Arranged:    HH Agency:     Status of Service:  Completed, signed off  Medicare Important Message Given:  Yes Date Medicare IM Given:    Medicare IM give by:    Date Additional Medicare IM Given:    Additional Medicare Important Message give by:     If discussed at [place] of Stay Meetings, dates discussed:    Additional Comments:  Dawayne Patricia, RN 05/12/2016, 3:12 PM

## 2016-05-12 NOTE — Discharge Instructions (Signed)
Acute Coronary Syndrome  Acute coronary syndrome (ACS) is a serious problem in which there is suddenly not enough blood and oxygen supplied to the heart. ACS may mean that one or more of the blood vessels in your heart (coronary arteries) may be blocked. ACS can result in chest pain or a heart attack (myocardial infarction or MI).  CAUSES  This condition is caused by atherosclerosis, which is the buildup of fat and cholesterol (plaque) on the inside of the arteries. Over time, the plaque may narrow or block the artery, and this will lessen blood flow to the heart. Plaque can also become weak and break off within a coronary artery to form a clot and cause a sudden blockage.  RISK FACTORS  The risks factors of this condition include:   High cholesterol levels.   High blood pressure (hypertension).   Smoking.   Diabetes.   Age.   Family history of chest pain, heart disease, or stroke.   Lack of exercise.  SYMPTOMS  The most common signs of this condition include:   Chest pain, which can be:    A crushing or squeezing in the chest.    A tightness, pressure, fullness, or heaviness in the chest.    Present for more than a few minutes, or it can stop and recur.   Pain in the arms, neck, jaw, or back.   Unexplained heartburn or indigestion.   Shortness of breath.   Nausea.   Sudden cold sweats.   Feeling light-headed or dizzy.  Sometimes, this condition has no symptoms.  DIAGNOSIS  ACS may be diagnosed through the following tests:   Electrocardiogram (ECG).   Blood tests.   Coronary angiogram. This is a procedure to look at the coronary arteries to see if there is any blockage.  TREATMENT  Treatment for ACS may include:   Healthy behavioral changes to reduce or control risk factors.   Medicine.   Coronary stenting.A stent helps to keep an artery open.   Coronary angioplasty. This procedure widens a narrowed or blocked artery.   Coronary artery bypass surgery. This will allow your blood to pass the  blockage (bypass) to reach your heart.  HOME CARE INSTRUCTIONS  Eating and Drinking   Follow a heart-healthy diet. A dietitian can you help to educate you about healthy food options and changes.   Use healthy cooking methods such as roasting, grilling, broiling, baking, poaching, steaming, or stir-frying. Talk to a dietitian to learn more about healthy cooking methods.  Medicines   Take medicines only as directed by your health care provider.   Do not take the following medicines unless your health care provider approves:    Nonsteroidal anti-inflammatory drugs (NSAIDs), such as ibuprofen, naproxen, or celecoxib.    Vitamin supplements that contain vitamin A, vitamin E, or both.    Hormone replacement therapy that contains estrogen with or without progestin.   Stop illegal drug use.  Activities   Follow an exercise program that is approved by your health care provider.   Plan rest periods when you are fatigued.  Lifestyle   Do not use any tobacco products, including cigarettes, chewing tobacco, or electronic cigarettes. If you need help quitting, ask your health care provider.   If you drink alcohol, and your health care provider approves, limit your alcohol intake to no more than 1 drink per day. One drink equals 12 ounces of beer, 5 ounces of wine, or 1 ounces of hard liquor.   Learn to manage   stress.   Maintain a healthy weight. Lose weight as approved by your health care provider.  General Instructions   Manage other health conditions, such as hypertension and diabetes, as directed by your health care provider.   Keep all follow-up visits as directed by your health care provider. This is important.   Your health care provider may ask you to monitor your blood pressure. A blood pressure reading consists of a higher number over a lower number, such as 110 over 72, written as 110/72. Ideally, your blood pressure should be:    Below 140/90 if you have no other medical conditions.    Below 130/80 if  you have diabetes or kidney disease.  SEEK IMMEDIATE MEDICAL CARE IF:   You have pain in your chest, neck, arm, jaw, stomach, or back that lasts more than a few minutes, is recurring, or is not relieved by taking medicine under your tongue (sublingual nitroglycerin).   You have profuse sweating without cause.   You have unexplained:    Heartburn or indigestion.    Shortness of breath or difficulty breathing.    Nausea or vomiting.    Fatigue.    Feelings of nervousness or anxiety.    Weakness.    Diarrhea.   You have sudden light-headedness or dizziness.   You faint.  These symptoms may represent a serious problem that is an emergency. Do not wait to see if the symptoms will go away. Get medical help right away. Call your local emergency services (911 in the [place] Do not drive yourself to the clinic or hospital.     This information is not intended to replace advice given to you by your health care provider. Make sure you discuss any questions you have with your health care provider.     Document Released: 12/11/2005 Document Revised: 01/01/2015 Document Reviewed: 04/14/2014  Elsevier Interactive Patient Education 2016 [employer]

## 2016-05-12 NOTE — Consult Note (Signed)
Reason for Consult:Severe 2 vessel CAD s/p NSTEMI Referring Physician: Dr. Randell Loop Gregory Dyer is an 70 y.o. male.  HPI: 68 man with a past history of a fall from 66' with head and multiple orthopedic injuries, CAD stent placed in circumflex in [place], and tobacco abuse. He was admitted 5/12 with a hypertensive emergency and worsening dyspnea progressing to respiratory failure. He was treated for a presumptive community acquired pneumonia. He ruled in for an MI with a peak troponin of 7.25. He improved and was extubated. An echo cardiogram showed an EF of 35% with moderate MR. On 5/14 he had multiple runs of VT culminating in a VT arrest. He was taken for urgent catheterization. Cardiac catheterization revealed severe 2 vessel CAD (RCA and circumflex), LVEDP= 43 mmHg, and EF estimated at 15-20%. Attempts to pass the blockages with a wire were unsuccessful.  He has not had any chest pain since admission.  He fell 140' from a ladder while living in [place]. He had a closed head injury and multiple orthopedic injuries. He lives with his fiance, who says he has "neurologic damage." He thinks his cardiac stent was placed while hospitalized after his fall, but he has no memory of it. He is able to ambulate with a limp. He lost contact with his doctor when he moved to [facility] and was not taking medications for 5 months prior to admission.  Past Medical History  Diagnosis Date  . Hypertension   . CAD (coronary artery disease)     stent in [place]  . Closed head injury     Fall 140' treated in [place]  . Tobacco abuse     Past Surgical History  Procedure Laterality Date  . Hernia repair    . Cardiac catheterization N/A 05/08/2016    Procedure: Left Heart Cath and Coronary Angiography;  Surgeon: Adrian Prows, MD;  Location: [facility];  Service: Cardiovascular;  Laterality: N/A;    History reviewed. No pertinent family history.  Social History:  reports that he has been smoking  Cigarettes.  He has a 50 pack-year smoking history. He has never used smokeless tobacco. He reports that he drinks about 8.4 oz of alcohol per week. He reports that he does not use illicit drugs.  Allergies:  Allergies  Allergen Reactions  . No Known Allergies     Medications:  Scheduled: . amiodarone  200 mg Oral Daily  . aspirin  81 mg Oral Daily  . atorvastatin  80 mg Oral q1800  . carvedilol  3.125 mg Oral BID WC  . clopidogrel  75 mg Oral Q breakfast  . feeding supplement (ENSURE ENLIVE)  237 mL Oral BID BM  . furosemide  40 mg Oral Daily  . heparin subcutaneous  5,000 Units Subcutaneous Q8H  . insulin aspart  0-20 Units Subcutaneous TID WC & HS  . ipratropium-albuterol  3 mL Nebulization TID  . isosorbide mononitrate  30 mg Oral Daily  . ondansetron (ZOFRAN) IV  8 mg Intravenous Once  . sacubitril-valsartan  1 tablet Oral BID  . sodium bicarbonate  100 mEq Intravenous Once  . spironolactone  12.5 mg Oral Daily    Results for orders placed or performed during the hospital encounter of 05/05/16 (from the past 48 hour(s))  Glucose, capillary     Status: None   Collection Time: 05/10/16 12:10 PM  Result Value Ref Range   Glucose-Capillary 97 65 - 99 mg/dL   Comment 1 Capillary Specimen   Glucose, capillary  Status: Abnormal   Collection Time: 05/10/16  4:07 PM  Result Value Ref Range   Glucose-Capillary 160 (H) 65 - 99 mg/dL   Comment 1 Capillary Specimen   Glucose, capillary     Status: Abnormal   Collection Time: 05/10/16  9:37 PM  Result Value Ref Range   Glucose-Capillary 111 (H) 65 - 99 mg/dL   Comment 1 Capillary Specimen    Comment 2 Notify RN   CBC     Status: Abnormal   Collection Time: 05/11/16  4:20 AM  Result Value Ref Range   WBC 7.6 4.0 - 10.5 K/uL   RBC 3.48 (L) 4.22 - 5.81 MIL/uL   Hemoglobin 11.2 (L) 13.0 - 17.0 g/dL   HCT 34.0 (L) 39.0 - 52.0 %   MCV 97.7 78.0 - 100.0 fL   MCH 32.2 26.0 - 34.0 pg   MCHC 32.9 30.0 - 36.0 g/dL   RDW 13.4 11.5  - 15.5 %   Platelets 137 (L) 150 - 400 K/uL  Basic metabolic panel     Status: Abnormal   Collection Time: 05/11/16  4:20 AM  Result Value Ref Range   Sodium 140 135 - 145 mmol/L   Potassium 3.5 3.5 - 5.1 mmol/L   Chloride 100 (L) 101 - 111 mmol/L   CO2 27 22 - 32 mmol/L   Glucose, Bld 127 (H) 65 - 99 mg/dL   BUN 24 (H) 6 - 20 mg/dL   Creatinine, Ser 1.43 (H) 0.61 - 1.24 mg/dL   Calcium 8.1 (L) 8.9 - 10.3 mg/dL   GFR calc non Af Amer 48 (L) >60 mL/min   GFR calc Af Amer 56 (L) >60 mL/min    Comment: (NOTE) The eGFR has been calculated using the CKD EPI equation. This calculation has not been validated in all clinical situations. eGFR's persistently <60 mL/min signify possible Chronic Kidney Disease.    Anion gap 13 5 - 15  Procalcitonin     Status: None   Collection Time: 05/11/16  4:20 AM  Result Value Ref Range   Procalcitonin 0.20 ng/mL    Comment:        Interpretation: PCT (Procalcitonin) <= 0.5 ng/mL: Systemic infection (sepsis) is not likely. Local bacterial infection is possible. (NOTE)         ICU PCT Algorithm               Non ICU PCT Algorithm    ----------------------------     ------------------------------         PCT < 0.25 ng/mL                 PCT < 0.1 ng/mL     Stopping of antibiotics            Stopping of antibiotics       strongly encouraged.               strongly encouraged.    ----------------------------     ------------------------------       PCT level decrease by               PCT < 0.25 ng/mL       >= 80% from peak PCT       OR PCT 0.25 - 0.5 ng/mL          Stopping of antibiotics  encouraged.     Stopping of antibiotics           encouraged.    ----------------------------     ------------------------------       PCT level decrease by              PCT >= 0.25 ng/mL       < 80% from peak PCT        AND PCT >= 0.5 ng/mL            Continuin g antibiotics                                               encouraged.       Continuing antibiotics            encouraged.    ----------------------------     ------------------------------     PCT level increase compared          PCT > 0.5 ng/mL         with peak PCT AND          PCT >= 0.5 ng/mL             Escalation of antibiotics                                          strongly encouraged.      Escalation of antibiotics        strongly encouraged.   Magnesium     Status: None   Collection Time: 05/11/16  4:20 AM  Result Value Ref Range   Magnesium 1.9 1.7 - 2.4 mg/dL  Brain natriuretic peptide     Status: Abnormal   Collection Time: 05/11/16  4:20 AM  Result Value Ref Range   B Natriuretic Peptide 787.1 (H) 0.0 - 100.0 pg/mL  Phosphorus     Status: None   Collection Time: 05/11/16  4:20 AM  Result Value Ref Range   Phosphorus 2.6 2.5 - 4.6 mg/dL  Glucose, capillary     Status: Abnormal   Collection Time: 05/11/16  8:05 AM  Result Value Ref Range   Glucose-Capillary 122 (H) 65 - 99 mg/dL   Comment 1 Capillary Specimen   Glucose, capillary     Status: Abnormal   Collection Time: 05/11/16 11:12 AM  Result Value Ref Range   Glucose-Capillary 240 (H) 65 - 99 mg/dL   Comment 1 Capillary Specimen   Glucose, capillary     Status: Abnormal   Collection Time: 05/11/16  4:20 PM  Result Value Ref Range   Glucose-Capillary 194 (H) 65 - 99 mg/dL   Comment 1 Capillary Specimen   Glucose, capillary     Status: Abnormal   Collection Time: 05/11/16  9:22 PM  Result Value Ref Range   Glucose-Capillary 192 (H) 65 - 99 mg/dL   Comment 1 Notify RN    Comment 2 Document in Chart   CBC     Status: Abnormal   Collection Time: 05/12/16  5:12 AM  Result Value Ref Range   WBC 7.4 4.0 - 10.5 K/uL   RBC 3.67 (L) 4.22 - 5.81 MIL/uL   Hemoglobin 11.8 (L) 13.0 - 17.0 g/dL   HCT 35.2 (L) 39.0 - 52.0 %   MCV  95.9 78.0 - 100.0 fL   MCH 32.2 26.0 - 34.0 pg   MCHC 33.5 30.0 - 36.0 g/dL   RDW 13.0 11.5 - 15.5 %   Platelets 158 150 - 400 K/uL   Glucose, capillary     Status: Abnormal   Collection Time: 05/12/16  5:59 AM  Result Value Ref Range   Glucose-Capillary 124 (H) 65 - 99 mg/dL    [name] 1 View  05/11/2016  CLINICAL DATA:  Acute respiratory failure, community-acquired pneumonia, current smoker. EXAM: PORTABLE CHEST 1 VIEW COMPARISON:  Portable chest x-ray of May 10, 2016 and May 05, 2016. FINDINGS: The lungs are well-expanded. There is no alveolar infiltrate. The interstitial markings remain diffusely increased but are slightly less conspicuous today. The cardiac silhouette remains enlarged. The pulmonary vascularity remains engorged and indistinct. The mediastinum is widened but stable. The right internal jugular venous catheter tip projects over the cavoatrial junction. IMPRESSION: Slight interval decrease in pulmonary interstitial edema. Stable cardiomegaly. Stable prominence of the mediastinum. Electronically Signed   By: David  Martinique M.D.   On: 05/11/2016 07:20    Review of Systems  Constitutional: Positive for malaise/fatigue.  Respiratory: Positive for cough, shortness of breath and wheezing.   Cardiovascular: Positive for orthopnea. Negative for chest pain.  Musculoskeletal: Positive for joint pain.  Neurological: Positive for focal weakness and loss of consciousness.       Memory loss   Blood pressure 125/58, pulse 73, temperature 98 F (36.7 C), temperature source Oral, resp. rate 20, height _0  (1.727 m), weight 177 lb 3.2 oz (80.377 kg), SpO2 97 %. Physical Exam  Vitals reviewed. Constitutional: He is oriented to person, place, and time. He appears well-developed and well-nourished. No distress.  HENT:  Head: Normocephalic.  Mouth/Throat: No oropharyngeal exudate.  Eyes: Conjunctivae and EOM are normal. No scleral icterus.  Neck: Neck supple. No thyromegaly present.  Cardiovascular: Normal rate and regular rhythm.   Murmur (2/6 systolic murmur) heard. Respiratory: Effort normal and breath sounds  normal. No respiratory distress. He has no wheezes. He has no rales.  GI: Soft. He exhibits no distension. There is no tenderness.  Musculoskeletal:  Limited ROM right shoulder  Lymphadenopathy:    He has no cervical adenopathy.  Neurological: He is alert and oriented to person, place, and time.  Skin: Skin is warm and dry.   CARDIAC CATHETERIZATION 1. Markedly depressed LV systolic function, EF appeared to be 15-20% with global hypokinesis. Only hand contrast injection was performed due to markedly elevated LVEDP of 43 mmHg. 2. Severe disease of the RCA and circumflex coronary artery, there was a stent in the midsegment of the circumflex which is occluded. No distal target vessel was visualized. It gave faint collaterals to the RCA, most of the collaterals From LAD septal perforators. 3. Right coronary artery appears to be superdominant. It is occluded in the proximal segment. 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 There were bridging collaterals and also contralateral collaterals.  4. Unsuccessful attempt to cross the circumflex stent and also proximal RCA occlusion, performed as a desperate attempt to revascularize the lesions.  Recommendation: Suspect that the patient's presentation is probably due to acute decompensated heart failure and possible superadded infection. I do not think this is a STEMI, both lesions appear to be stable and chronic and elevated troponin could be related to demand ischemia.  Extremely guarded prognosis with markedly elevated LVEDP. Circulatory assist device but not placed as I had great amount of difficulty in  obtaining access to his right femoral artery, severe calcification was evident, significant tortuosity of the abdominal aorta, I also suspect there probably is a large abdominal and Some. High Mortality, Suspect to Be Greater Than 70-80% Just Based on His Overall Presentation, Cardiogenic Shock and Also Suspect He Might Develop Multiorgan Failure. He Remained in  Metabolic and Respiratory Acidosis In Spite Of Being on the Ventilator. A Total of 90 ML Contrast Utilized.  ECHOCARDIOGRAM Study Conclusions  - Left ventricle: The cavity size was normal. Systolic function was  moderately reduced. The estimated ejection fraction was in the  range of 35% to 40%. Severe hypokinesis of the basal-midinferior  myocardium. - Mitral valve: Calcified annulus. Moderately thickened leaflets .  There was moderate regurgitation. Valve area by continuity  equation (using LVOT flow): 1.45 cm^2. - Atrial septum: No defect or patent foramen ovale was identified.  I personally reviewed the echo and catheterization films and agree with the above findings with the exception of I believe the echo may overstate and catheterization may understate the EF  Assessment/Plan: 71 yo man with a history of CAD (unknown to him, evidenced by a stent in his circumflex) who presented with acute respiratory failure. Possible CAP and also a component of cardiogenic pulmonary edema. While hospitalized he ruled in for an MI and had a VT arrest. Catheterization revealed totally occluded RCA and circumflex with no significant LAD disease.  He has severe 2 vessel disease not including the LAD. He has impaired LV function. He has neurological and orthopedic issues related to a prior fall years ago. He was noncompliant (involuntarily) with medications prior to admission.  With 2 vessel disease with a near normal LAD his survival benefit from CABG v optimal medical therapy is slight, particularly in light of his operative risk. He does have a decent target in the PDA but unknown target in the circumflex distribution. He did not fail optimal medical therapy, but was failing to take medical therapy.  I would favor a trial of optimal medical therapy at this point. If he recurrent issues or angina refractory to medical therapy we could reconsider CABG risk/ benefit.  Melrose Nakayama 05/12/2016, 9:50 AM

## 2016-05-12 NOTE — Progress Notes (Signed)
Subjective:  Patient denies any chest pain or shortness of breath.  Eager to go home.  Not sure whether he was seen by CVTS  Objective:  Vital Signs in the last 24 hours: Temp:  [97.2 F (36.2 C)-98.1 F (36.7 C)] 98 F (36.7 C) (05/19 0340) Pulse Rate:  [68-86] 73 (05/19 0340) Resp:  [15-29] 20 (05/19 0340) BP: (89-125)/(52-86) 125/58 mmHg (05/19 0340) SpO2:  [92 %-100 %] 93 % (05/19 0340) Weight:  [80.377 kg (177 lb 3.2 oz)] 80.377 kg (177 lb 3.2 oz) (05/19 0500)  Intake/Output from previous day: 05/18 0701 - 05/19 0700 In: 780 [P.O.:600; I.V.:30; IV Piggyback:150] Out: 1225 [Urine:1225] Intake/Output from this shift:    Physical Exam: Neck: no adenopathy, no carotid bruit, no JVD and supple, symmetrical, trachea midline Lungs: decreased breath sounds at bases with faint rales.  Air entry improved Heart: regular rate and rhythm, S1, S2 normal and soft systolic murmur noted Abdomen: soft, non-tender; bowel sounds normal; no masses,  no organomegaly Extremities: extremities normal, atraumatic, no cyanosis or edema  Lab Results:  Recent Labs  05/11/16 0420 05/12/16 0512  WBC 7.6 7.4  HGB 11.2* 11.8*  PLT 137* 158    Recent Labs  05/10/16 0445 05/11/16 0420  NA 143 140  K 3.3* 3.5  CL 100* 100*  CO2 30 27  GLUCOSE 122* 127*  BUN 30* 24*  CREATININE 1.45* 1.43*   No results for input(s): TROPONINI in the last 72 hours.  Invalid input(s): CK, MB Hepatic Function Panel No results for input(s): PROT, ALBUMIN, AST, ALT, ALKPHOS, BILITOT, BILIDIR, IBILI in the last 72 hours. No results for input(s): CHOL in the last 72 hours. No results for input(s): PROTIME in the last 72 hours.  Imaging: Imaging results have been reviewed and [name] 1 View  05/11/2016  CLINICAL DATA:  Acute respiratory failure, community-acquired pneumonia, current smoker. EXAM: PORTABLE CHEST 1 VIEW COMPARISON:  Portable chest x-ray of May 10, 2016 and May 05, 2016. FINDINGS: The lungs  are well-expanded. There is no alveolar infiltrate. The interstitial markings remain diffusely increased but are slightly less conspicuous today. The cardiac silhouette remains enlarged. The pulmonary vascularity remains engorged and indistinct. The mediastinum is widened but stable. The right internal jugular venous catheter tip projects over the cavoatrial junction. IMPRESSION: Slight interval decrease in pulmonary interstitial edema. Stable cardiomegaly. Stable prominence of the mediastinum. Electronically Signed   By: David  Martinique M.D.   On: 05/11/2016 07:20    Cardiac Studies:  Assessment/Plan:  Status post Acute small non-Q-wave myocardial infarction complicated by acute pulmonary edema Status post sustained VT  Status post Inferoposterior wall acute injury pattern in the setting of marked sinus tachycardia/VT status post left cath and attempted PCI to left circumflex and RCA. Ischemic dilated cardiomyopathy Multivessel CAD Hyperlipidemia New-onset diabetes mellitus Status postAcute respiratory failure secondary to above/possible bilateral pneumonia Status post hypertensive emergency Tobacco abuse ResolvingAcute kidney injury secondary to hypotension/contrast  Plan Change IV Lasix to by mouth as per orders. Add low-dose spironolactone. Dr. Doylene Canard on call for weekend If discharged.  Follow-up with me in one week  LOS: 7 days    [name] 05/12/2016, 8:19 AM

## 2016-05-12 NOTE — Progress Notes (Signed)
Pt in stable condition, this RN went over discharge instructions with pt and family, they verbalised understanding, paper prescriptions given to pt, central line dc, tele dc, ccmd notified, pt taken off the floor on a wheelchair by an NT.

## 2016-05-12 NOTE — Discharge Summary (Signed)
[facility]  Physician Discharge Summary   Patient ID: Gregory Dyer MRN: [id number] DOB/AGE: 70/26/1946 70 y.o.  Admit date: 05/05/2016 Discharge date: 05/12/2016  PCP: No primary care provider on file.  DISCHARGE DIAGNOSES:  Active Problems:   CAP (community acquired pneumonia)   Acute respiratory failure with hypoxia (HCC)   Elevated troponin   Hypertensive emergency   NSTEMI (non-ST elevated myocardial infarction) ([place])   Sepsis due to pneumonia ([place])   Acute encephalopathy   Hyperglycemia   Tobacco abuse   RECOMMENDATIONS FOR OUTPATIENT FOLLOW UP: 1. Patient to follow-up with his cardiologist in 1 week   DISCHARGE CONDITION: fair  Diet recommendation: Heart healthy  Filed Weights   05/09/16 0300 05/12/16 0340 05/12/16 0500  Weight: 85.7 kg (188 lb 15 oz) 80.377 kg (177 lb 3.2 oz) 80.377 kg (177 lb 3.2 oz)    INITIAL HISTORY: 70 year old Caucasian male with no previously documented health issues, presented with shortness of breath and cough. He was diaphoretic. He had to be intubated. Initially concern was about pneumonia. However, it was felt later on, the patient had congestive heart failure. He was found to have non-ST elevation MI. Cardiology was consulted.   Consultations:  Cardiology  Critical care medicine  Procedures: 5/13 echocardiogram;- LVEFf 35% to 40%. Severe hypokinesis of the basal-midinferior  myocardium. - Mitral valve:moderate regurgitation.  5/14- Cardiac arrest with VT, urgent cath.   5/15 left heart cath; LVEF=15-20% with global hypokinesis. -LVEDP of 43 mmHg. 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 No distal target vessel was visualized.-Faint collaterals to the RCA, most of the collaterals From LAD septal perforators. Right coronary artery appears to be superdominant. It is occluded in the proximal segment. 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 There  were bridging collaterals and also contralateral collaterals.   HOSPITAL COURSE:   Acute hypoxic/hypercapnic respiratory failure/pulmonary edema/ Sepsis CAP His presentation was most likely multifactorial to include CAP, NSTEMI cardiac arrest, CHF. He was extubated and improved. He was placed on antibiotics. Patient is now back to baseline. Saturating normal on room air. Patient completed a course of antibiotics.  Ventricular tachycardia Patient developed sustained VT while he was in the intensive care unit. He underwent cardiac catheterization as discussed above. Unsuccessful attempt at PCI to left circumflex and RCA. Patient was placed on amiodarone and carvedilol. Cardiology to follow as outpatient. Now he is in sinus rhythm.  Acute systolic congestive heart failure Patient is reasonably well compensated at this time. Continue with the medications as prescribed including diuretics, Ernesto, beta blocker.  NSTEMI/coronary artery disease Cardiac catheterization was done as discussed above. Medical management currently. Placed on aspirin and Plavix. Also seen by cardiothoracic surgery. No indication for bypass surgery at this time. Outpatient follow-up with cardiology.  Acute renal failure Creatinine was as high as 1.6. Now down to 1.4. He has good urine output. Continue to monitor as outpatient.  Hyperglycemia No reported hx of DM. A1c= 5.5. Outpatient follow-up. No need for any medications at this time.  Acute encephalopathy  Most likely multifactorial secondary to respiratory failure, sepsis/pneumonia. Mental status is close to baseline now. No focal neurological deficits on examination today.  Patient feels much better. He wants to go home. Seen by cardiology and cardiothoracic surgery this morning. Also seen by physical therapy. He lives with his fiance. Okay for discharge home today. Close follow-up with cardiology.    PERTINENT LABS:  The results of significant diagnostics  from  this hospitalization (including imaging, microbiology, ancillary and laboratory) are listed below for reference.    Microbiology: Recent Results (from the past 240 hour(s))  Blood Culture (routine x 2)     Status: None   Collection Time: 05/05/16  3:45 PM  Result Value Ref Range Status   Specimen Description BLOOD LEFT ARM  Final   Special Requests BOTTLES DRAWN AEROBIC AND ANAEROBIC 5CC  Final   Culture   Final    NO GROWTH 5 DAYS Performed at [facility]    Report Status 05/10/2016 FINAL  Final  Blood Culture (routine x 2)     Status: None   Collection Time: 05/05/16  4:01 PM  Result Value Ref Range Status   Specimen Description BLOOD LEFT HAND  Final   Special Requests BOTTLES DRAWN AEROBIC ONLY [place]  Final   Culture   Final    NO GROWTH 5 DAYS Performed at [facility]    Report Status 05/10/2016 FINAL  Final  Urine culture     Status: None   Collection Time: 05/05/16  4:41 PM  Result Value Ref Range Status   Specimen Description URINE, CLEAN CATCH  Final   Special Requests NONE  Final   Culture NO GROWTH Performed at [facility]   Final   Report Status 05/06/2016 FINAL  Final  MRSA PCR Screening     Status: None   Collection Time: 05/05/16  7:50 PM  Result Value Ref Range Status   MRSA by PCR NEGATIVE NEGATIVE Final    Comment:        The GeneXpert MRSA Assay (FDA approved for NASAL specimens only), is one component of a comprehensive MRSA colonization surveillance program. It is not intended to diagnose MRSA infection nor to guide or monitor treatment for MRSA infections.   Respiratory Panel by PCR     Status: None   Collection Time: 05/05/16  8:38 PM  Result Value Ref Range Status   Adenovirus NOT DETECTED NOT DETECTED Final   Coronavirus 229E NOT DETECTED NOT DETECTED Final   Coronavirus HKU1 NOT DETECTED NOT DETECTED Final   Coronavirus NL63 NOT DETECTED NOT DETECTED Final   Coronavirus OC43 NOT DETECTED NOT DETECTED Final    Metapneumovirus NOT DETECTED NOT DETECTED Final   Rhinovirus / Enterovirus NOT DETECTED NOT DETECTED Final   Influenza A NOT DETECTED NOT DETECTED Final   Influenza A H1 NOT DETECTED NOT DETECTED Final   Influenza A H1 2009 NOT DETECTED NOT DETECTED Final   Influenza A H3 NOT DETECTED NOT DETECTED Final   Influenza B NOT DETECTED NOT DETECTED Final   Parainfluenza Virus 1 NOT DETECTED NOT DETECTED Final   Parainfluenza Virus 2 NOT DETECTED NOT DETECTED Final   Parainfluenza Virus 3 NOT DETECTED NOT DETECTED Final   Parainfluenza Virus 4 NOT DETECTED NOT DETECTED Final   Respiratory Syncytial Virus NOT DETECTED NOT DETECTED Final   Bordetella pertussis NOT DETECTED NOT DETECTED Final   Chlamydophila pneumoniae NOT DETECTED NOT DETECTED Final   Mycoplasma pneumoniae NOT DETECTED NOT DETECTED Final    Comment: Performed at [facility]  Culture, respiratory (NON-Expectorated)     Status: None   Collection Time: 05/06/16  9:04 AM  Result Value Ref Range Status   Specimen Description ENDOTRACHEAL  Final   Special Requests NONE  Final   Gram Stain   Final    ABUNDANT WBC PRESENT, PREDOMINANTLY PMN RARE SQUAMOUS EPITHELIAL CELLS PRESENT RARE GRAM POSITIVE COCCI IN PAIRS RARE  GRAM POSITIVE RODS Performed at [facility]    Culture   Final    NORMAL OROPHARYNGEAL FLORA Performed at [facility]    Report Status 05/09/2016 FINAL  Final     Labs: Basic Metabolic Panel:  Recent Labs Lab 05/09/16 0529 05/09/16 1230 05/09/16 2115 05/09/16 2234 05/10/16 0445 05/11/16 0420  NA 145  --  136 144 143 140  K 2.8*  --  3.5 3.8 3.3* 3.5  CL 105  --  92* 103 100* 100*  CO2 26  --  27 31 30 27   GLUCOSE 185*  --  465* 129* 122* 127*  BUN 41*  --  31* 32* 30* 24*  CREATININE 1.65*  --  1.51* 1.49* 1.45* 1.43*  CALCIUM 7.7*  --  7.2* 8.0* 7.6* 8.1*  MG 1.9 1.9 2.0  --  2.1 1.9  PHOS 1.1* 3.6 20.7*  --  4.0 2.6   Liver Function Tests:  Recent Labs Lab  05/05/16 1545 05/06/16 0500  AST 58* 34  ALT 17 17  ALKPHOS 78 52  BILITOT 1.1 0.8  PROT 8.0 5.3*  ALBUMIN 4.4 2.9*   CBC:  Recent Labs Lab 05/05/16 1545  05/08/16 0420 05/09/16 0400 05/10/16 0445 05/11/16 0420 05/12/16 0512  WBC 20.6*  < > 9.0 7.0 8.1 7.6 7.4  NEUTROABS 7.2  --   --   --   --   --   --   HGB 17.5*  < > 12.5* 10.9* 10.7* 11.2* 11.8*  HCT 52.7*  < > 37.8* 32.1* 33.3* 34.0* 35.2*  MCV 100.8*  < > 97.4 97.0 99.7 97.7 95.9  PLT 187  < > 137* 104* 124* 137* 158  < > = values in this interval not displayed. Cardiac Enzymes:  Recent Labs Lab 05/06/16 1700 05/06/16 2303 05/07/16 0428 05/07/16 2340 05/08/16 0420 05/09/16 0529  CKTOTAL  --   --   --   --  84  --   CKMB  --   --   --   --  2.6  --   TROPONINI 5.93* 5.03* 5.02* 2.57*  --  1.46*   BNP: BNP (last 3 results)  Recent Labs  05/05/16 1545 05/11/16 0420  BNP 1255.8* 787.1*     CBG:  Recent Labs Lab 05/11/16 1112 05/11/16 1620 05/11/16 2122 05/12/16 0559 05/12/16 1103  GLUCAP 240* 194* 192* 124* 163*     IMAGING STUDIES [name] 1 View  05/11/2016  CLINICAL DATA:  Acute respiratory failure, community-acquired pneumonia, current smoker. EXAM: PORTABLE CHEST 1 VIEW COMPARISON:  Portable chest x-ray of May 10, 2016 and May 05, 2016. FINDINGS: The lungs are well-expanded. There is no alveolar infiltrate. The interstitial markings remain diffusely increased but are slightly less conspicuous today. The cardiac silhouette remains enlarged. The pulmonary vascularity remains engorged and indistinct. The mediastinum is widened but stable. The right internal jugular venous catheter tip projects over the cavoatrial junction. IMPRESSION: Slight interval decrease in pulmonary interstitial edema. Stable cardiomegaly. Stable prominence of the mediastinum. Electronically Signed   By: David  Martinique M.D.   On: 05/11/2016 07:20   [name] 1 View  05/10/2016  CLINICAL DATA:  Acute respiratory  failure.  Shortness of breath. EXAM: PORTABLE CHEST 1 VIEW COMPARISON:  05/09/2016. FINDINGS: Endotracheal tube and NG tube have been removed. Right IJ line noted with tip projected over the right atrium. Stable cardiomegaly. Diffuse bilateral pulmonary alveolar infiltrates and/or edema have progressed. No interim change. No pleural  effusion or pneumothorax . IMPRESSION: 1. Interim extubation removal of NG tube. Right IJ line stable position. 2. Interim progression of bilateral pulmonary infiltrates and/or edema. 3. Cardiomegaly. Electronically Signed   By: Marcello Moores  Register   On: 05/10/2016 07:40   [name] 1 View  05/09/2016  CLINICAL DATA:  Acute respiratory failure. Shortness of breath. Hypertension. EXAM: PORTABLE CHEST 1 VIEW COMPARISON:  05/08/2016 FINDINGS: Endotracheal tube with tip measuring 4.6 cm above the carina. Enteric tube tip is off the field of view but below the left hemidiaphragm. Right central venous catheter projects over the cavoatrial junction. No pneumothorax. Heart size and pulmonary vascularity are normal. Bilateral perihilar airspace disease likely representing edema although could be pneumonia. This appears to be slightly improved since previous study. Calcified and tortuous aorta IMPRESSION: Appliances appear in satisfactory location. Diffuse bilateral perihilar infiltration, likely edema, mild improvement since previous study Electronically Signed   By: Lucienne Capers M.D.   On: 05/09/2016 04:02   [name] 1 View  05/08/2016  CLINICAL DATA:  Endotracheal tube present EXAM: PORTABLE CHEST 1 VIEW COMPARISON:  05/07/2016; 05/06/2016; 05/05/2016 FINDINGS: Grossly unchanged enlarged cardiac silhouette and mediastinal contours. Stable positioning of support apparatus. No pneumothorax. The pulmonary vasculature remains indistinct with grossly unchanged perihilar airspace opacities with potential air bronchograms noted about the bilateral pulmonary hila, left greater than  right. No new focal airspace opacities. No definite pleural effusion, though note, the right costophrenic angle is excluded from view. Unchanged bones. IMPRESSION: 1.  Stable positioning of support apparatus.  No pneumothorax. 2. Similar findings most suggestive of alveolar pulmonary edema. Electronically Signed   By: Sandi Mariscal M.D.   On: 05/08/2016 07:17   [name] 1 View  05/07/2016  CLINICAL DATA:  Shortness of Breath EXAM: PORTABLE CHEST 1 VIEW COMPARISON:  05/06/2016 FINDINGS: Cardiomediastinal silhouette is stable. Stable endotracheal and NG tube position. Right IJ central line is unchanged in position. Again noted bilateral perihilar airspace opacification and mild interstitial prominence bilaterally consistent with pulmonary edema. No segmental infiltrates. IMPRESSION: Stable support apparatus. Bilateral pulmonary edema without change in aeration. Electronically Signed   By: Lahoma Crocker M.D.   On: 05/07/2016 10:48   [name] 1 View  05/06/2016  CLINICAL DATA:  Repositioning of central venous catheter. EXAM: PORTABLE CHEST 1 VIEW COMPARISON:  05/06/2016 and 05/05/2016 FINDINGS: Endotracheal tube and enteric tube unchanged. Right IJ central venous catheter has been pulled back as tip is just below the cavoatrial junction. Lungs are adequately inflated with worsening bilateral perihilar opacification compatible with edema. Remainder of the exam is unchanged. IMPRESSION: Worsening interstitial edema. Tubes and lines as described. Note that the right IJ central venous catheter has been pulled back slightly as tip is just below the cavoatrial junction. Electronically Signed   By: Marin Olp M.D.   On: 05/06/2016 12:08   [name] 1 View  05/06/2016  CLINICAL DATA:  Respiratory failure EXAM: PORTABLE CHEST 1 VIEW COMPARISON:  05/05/2016 FINDINGS: The ET tube is in good position. The NG tube terminates below today's film. The tip of the right IJ terminates deep within the right atrium.  Recommend withdrawing the line approximately 9 cm into the caval atrial junction. No pneumothorax. Pulmonary edema has improved but persists, a little more focal in the left perihilar region. No other interval changes. IMPRESSION: 1. The right IJ terminates in the right atrium. Recommend withdrawing 9 cm. 2. Improved but persistent pulmonary edema. 3. No other changes. These results  will be called to the ordering clinician or representative by the Radiologist Assistant, and communication documented in the PACS or zVision Dashboard. Electronically Signed   By: Dorise Bullion III M.D   On: 05/06/2016 06:56   [name] 1 View  05/05/2016  CLINICAL DATA:  Central line placement. Community acquired pneumonia. Respiratory distress. Shortness of breath and productive cough for 3 days. Hypoxia. EXAM: PORTABLE CHEST 1 VIEW COMPARISON:  05/05/2016 FINDINGS: A new right jugular central venous catheter is seen with tip overlying the inferior aspect of the right atrium, approximately 8 cm below the superior cavoatrial junction. A new nasogastric tube is seen entering the stomach. Endotracheal tube remains in appropriate position. No pneumothorax visualized. Moderate diffuse pulmonary interstitial and airspace disease is seen which is symmetric most likely due to diffuse pulmonary edema. Mild cardiomegaly remains stable. IMPRESSION: New right jugular central venous catheter tip overlies the inferior aspect of the right atrium, approximately 8 cm below the superior cavoatrial junction. No pneumothorax visualized. Stable moderate diffuse pulmonary edema pattern. Electronically Signed   By: Earle Gell M.D.   On: 05/05/2016 19:29   Dg Chest Portable 1 View  05/05/2016  CLINICAL DATA:  70 year old male with complaints of shortness of breath, and cough for the past 3 days. EXAM: PORTABLE CHEST 1 VIEW COMPARISON:  No priors. FINDINGS: An endotracheal tube is in place with tip 5.7 cm above the carina. Lung volumes appear  normal. Diffuse peribronchial cuffing, interstitial prominence in widespread airspace disease throughout the lungs bilaterally. Pulmonary vasculature is obscured. Relative sparing of the lung parenchyma in the periphery of the lungs bilaterally. No definite pleural effusions. Heart size appears upper limits of normal. Upper mediastinal contours are within normal limits. Atherosclerosis in the thoracic aorta. Coronary artery stent incidentally noted. IMPRESSION: 1. Endotracheal tube appears properly located. 2. Diffuse peribronchial cuffing, interstitial prominence and patchy airspace disease throughout the lungs bilaterally, concerning for severe bronchitis and multilobar bronchopneumonia. Findings are not favored to reflect underlying pulmonary edema, although noncardiogenic edema could be considered. 3. Atherosclerosis. Electronically Signed   By: Vinnie Langton M.D.   On: 05/05/2016 16:17   Dg Abd Portable 1v  05/05/2016  CLINICAL DATA:  NG tube placement EXAM: PORTABLE ABDOMEN - 1 VIEW COMPARISON:  None. FINDINGS: There is NG-tube coiled within proximal stomach with tip in mid stomach. Mild gaseous distended small bowel loops in left abdomen. IMPRESSION: NG tube coiled within proximal stomach with tip in mid stomach. Electronically Signed   By: Lahoma Crocker M.D.   On: 05/05/2016 21:01    DISCHARGE EXAMINATION: Filed Vitals:   05/12/16 0500 05/12/16 0823 05/12/16 0950 05/12/16 1334  BP:   119/65 96/42  Pulse:   70 70  Temp:   98.2 F (36.8 C) 98.4 F (36.9 C)  TempSrc:   Oral Oral  Resp:   20 16  Height:      Weight: 80.377 kg (177 lb 3.2 oz)     SpO2:  97% 98% 98%   General appearance: alert, cooperative, appears stated age and no distress Resp: clear to auscultation bilaterally Cardio: regular rate and rhythm, S1, S2 normal, no murmur, click, rub or gallop GI: soft, non-tender; bowel sounds normal; no masses,  no organomegaly Extremities: extremities normal, atraumatic, no cyanosis or  edema  DISPOSITION: Home  Discharge Instructions    (HEART FAILURE PATIENTS) Call MD:  Anytime you have any of the following symptoms: 1) 3 pound weight gain in 24 hours or 5 pounds in 1  week 2) shortness of breath, with or without a dry hacking cough 3) swelling in the hands, feet or stomach 4) if you have to sleep on extra pillows at night in order to breathe.    Complete by:  As directed      Call MD for:  difficulty breathing, headache or visual disturbances    Complete by:  As directed      Call MD for:  extreme fatigue    Complete by:  As directed      Call MD for:  persistant dizziness or light-headedness    Complete by:  As directed      Call MD for:  persistant nausea and vomiting    Complete by:  As directed      Call MD for:  severe uncontrolled pain    Complete by:  As directed      Call MD for:  temperature >100.4    Complete by:  As directed      Diet - low sodium heart healthy    Complete by:  As directed      Discharge instructions    Complete by:  As directed   Please take your medications as prescribed. Please follow-up with the cardiologist. You will benefit from establishing with a primary care physician as well.  You were cared for by a hospitalist during your hospital stay. If you have any questions about your discharge medications or the care you received while you were in the hospital after you are discharged, you can call the unit and asked to speak with the hospitalist on call if the hospitalist that took care of you is not available. Once you are discharged, your primary care physician will handle any further medical issues. Please note that NO REFILLS for any discharge medications will be authorized once you are discharged, as it is imperative that you return to your primary care physician (or establish a relationship with a primary care physician if you do not have one) for your aftercare needs so that they can reassess your need for medications and monitor your  lab values. If you do not have a primary care physician, you can call [phone] for a physician referral.     Increase activity slowly    Complete by:  As directed            ALLERGIES:  Allergies  Allergen Reactions  . No Known Allergies      Current Discharge Medication List    START taking these medications   Details  amiodarone (PACERONE) 200 MG tablet Take 1 tablet (200 mg total) by mouth daily. Qty: 30 tablet, Refills: 0    atorvastatin (LIPITOR) 80 MG tablet Take 1 tablet (80 mg total) by mouth daily at 6 PM. Qty: 30 tablet, Refills: 2    carvedilol (COREG) 3.125 MG tablet Take 1 tablet (3.125 mg total) by mouth 2 (two) times daily with a meal. Qty: 60 tablet, Refills: 2    clopidogrel (PLAVIX) 75 MG tablet Take 1 tablet (75 mg total) by mouth daily with breakfast. Qty: 30 tablet, Refills: 2    furosemide (LASIX) 40 MG tablet Take 1 tablet (40 mg total) by mouth daily. Qty: 30 tablet, Refills: 2    isosorbide mononitrate (IMDUR) 30 MG 24 hr tablet Take 1 tablet (30 mg total) by mouth daily. Qty: 30 tablet, Refills: 0    sacubitril-valsartan (ENTRESTO) 24-26 MG Take 1 tablet by mouth 2 (two) times daily. Qty: 60 tablet, Refills: 0  spironolactone (ALDACTONE) 25 MG tablet Take 0.5 tablets (12.5 mg total) by mouth daily. Qty: 30 tablet, Refills: 0      CONTINUE these medications which have CHANGED   Details  albuterol (PROVENTIL HFA;VENTOLIN HFA) 108 (90 Base) MCG/ACT inhaler Inhale 1 puff into the lungs 2 (two) times daily as needed for wheezing or shortness of breath. Qty: 1 Inhaler, Refills: 0    aspirin 81 MG chewable tablet Chew 1 tablet (81 mg total) by mouth daily. Qty: 30 tablet, Refills: 2      CONTINUE these medications which have NOT CHANGED   Details  Multiple Vitamin (MULTIVITAMIN WITH MINERALS) TABS tablet Take 1 tablet by mouth daily.      STOP taking these medications     PRESCRIPTION MEDICATION        Follow-up Information     Follow up with [name], MD. Schedule an appointment as soon as possible for a visit in 1 week.   Specialty:  Cardiology   Why:  post hospitalization follow up   Contact information:   [address] [place] [address] [phone]       TOTAL DISCHARGE TIME: 68 minutes  [facility] Pager [phone]  05/12/2016, 2:27 PM

## 2016-05-12 NOTE — Evaluation (Signed)
Physical Therapy Evaluation Patient Details Name: Gregory Dyer MRN: [id number] DOB: 10/19/1945 Today's Date: 05/12/2016   History of Present Illness  70 y.o. male with h/o fall from 76' with multiple fxs R UE/LE and closed head injury 7 years ago in [place], admitted with CAP, MI, sepsis respiratory failure, VT arrest 05/07/16, s/p failed cardiac cath.   Clinical Impression  Pt is independent with mobility and is ready to DC home from PT standpoint. He ambulated 160' without an assistive device, SaO2 96% on RA, HR 86 walking. No loss of balance with walking. No further PT needed, PT signing off.     Follow Up Recommendations No PT follow up    Equipment Recommendations  None recommended by PT    Recommendations for Other Services       Precautions / Restrictions Precautions Precautions: None Precaution Comments: pt reports having a limp from RLE fx 7 yrs ago, denies h/o falls in past year Restrictions Weight Bearing Restrictions: No      Mobility  Bed Mobility               General bed mobility comments: NT- up in chair  Transfers Overall transfer level: Independent                  Ambulation/Gait Ambulation/Gait assistance: Independent Ambulation Distance (Feet): 160 Feet Assistive device: None     Gait velocity interpretation: at or above normal speed for age/gender General Gait Details: HR 86 with ambulation, SaO2 96% on RA, no LOB, steady without AD, decr weight shift to RLE which is baseline since fall 7 yrs ago  [employer] (Stroke Patients Only)       Balance Overall balance assessment: No apparent balance deficits (not formally assessed)                                           Pertinent Vitals/Pain Pain Assessment: No/denies pain    Home Living Family/patient expects to be discharged to:: Private residence Living Arrangements: Spouse/significant  other Available Help at Discharge: Family;Available 24 hours/day   Home Access: Stairs to enter   Entrance Stairs-Number of Steps: 3 Home Layout: One level        Prior Function Level of Independence: Independent               Hand Dominance        Extremity/Trunk Assessment   Upper Extremity Assessment: Overall WFL for tasks assessed           Lower Extremity Assessment: Overall WFL for tasks assessed      Cervical / Trunk Assessment: Normal  Communication   Communication: No difficulties  Cognition Arousal/Alertness: Awake/alert Behavior During Therapy: WFL for tasks assessed/performed Overall Cognitive Status: Within Functional Limits for tasks assessed                      General Comments      Exercises        Assessment/Plan    PT Assessment Patent does not need any further PT services  PT Diagnosis     PT Problem List    PT Treatment Interventions     PT Goals (Current goals can be found in the Care Plan section) Acute Rehab PT Goals PT Goal Formulation:  All assessment and education complete, DC therapy    Frequency     Barriers to discharge        Co-evaluation               End of Session Equipment Utilized During Treatment: Gait belt Activity Tolerance: Patient tolerated treatment well Patient left: in chair;with call bell/phone within reach Nurse Communication: Mobility status         Time: 1244-1301 PT Time Calculation (min) (ACUTE ONLY): 17 min   Charges:   PT Evaluation $PT Eval Low Complexity: 1 Procedure     PT G Codes:        Philomena Doheny 05/12/2016, 1:10 PM [phone]

## 2016-05-17 DIAGNOSIS — F17290 Nicotine dependence, other tobacco product, uncomplicated: Secondary | ICD-10-CM | POA: Diagnosis not present

## 2016-05-17 DIAGNOSIS — E785 Hyperlipidemia, unspecified: Secondary | ICD-10-CM | POA: Diagnosis not present

## 2016-05-17 DIAGNOSIS — I214 Non-ST elevation (NSTEMI) myocardial infarction: Secondary | ICD-10-CM | POA: Diagnosis not present

## 2016-05-17 DIAGNOSIS — I2510 Atherosclerotic heart disease of native coronary artery without angina pectoris: Secondary | ICD-10-CM | POA: Diagnosis not present

## 2016-05-17 DIAGNOSIS — I119 Hypertensive heart disease without heart failure: Secondary | ICD-10-CM | POA: Diagnosis not present

## 2016-05-17 DIAGNOSIS — I472 Ventricular tachycardia: Secondary | ICD-10-CM | POA: Diagnosis not present

## 2016-05-28 IMAGING — DX DG CHEST 1V PORT
2 series · 2 of 2 positions shown · non-contrast
Comparison: 05/05/2016

CLINICAL DATA: Central line placement. Community acquired
pneumonia. Respiratory distress. Shortness of breath and productive
cough for 3 days. Hypoxia.

EXAM:
PORTABLE CHEST 1 VIEW

[chest ap (1 of 2)]
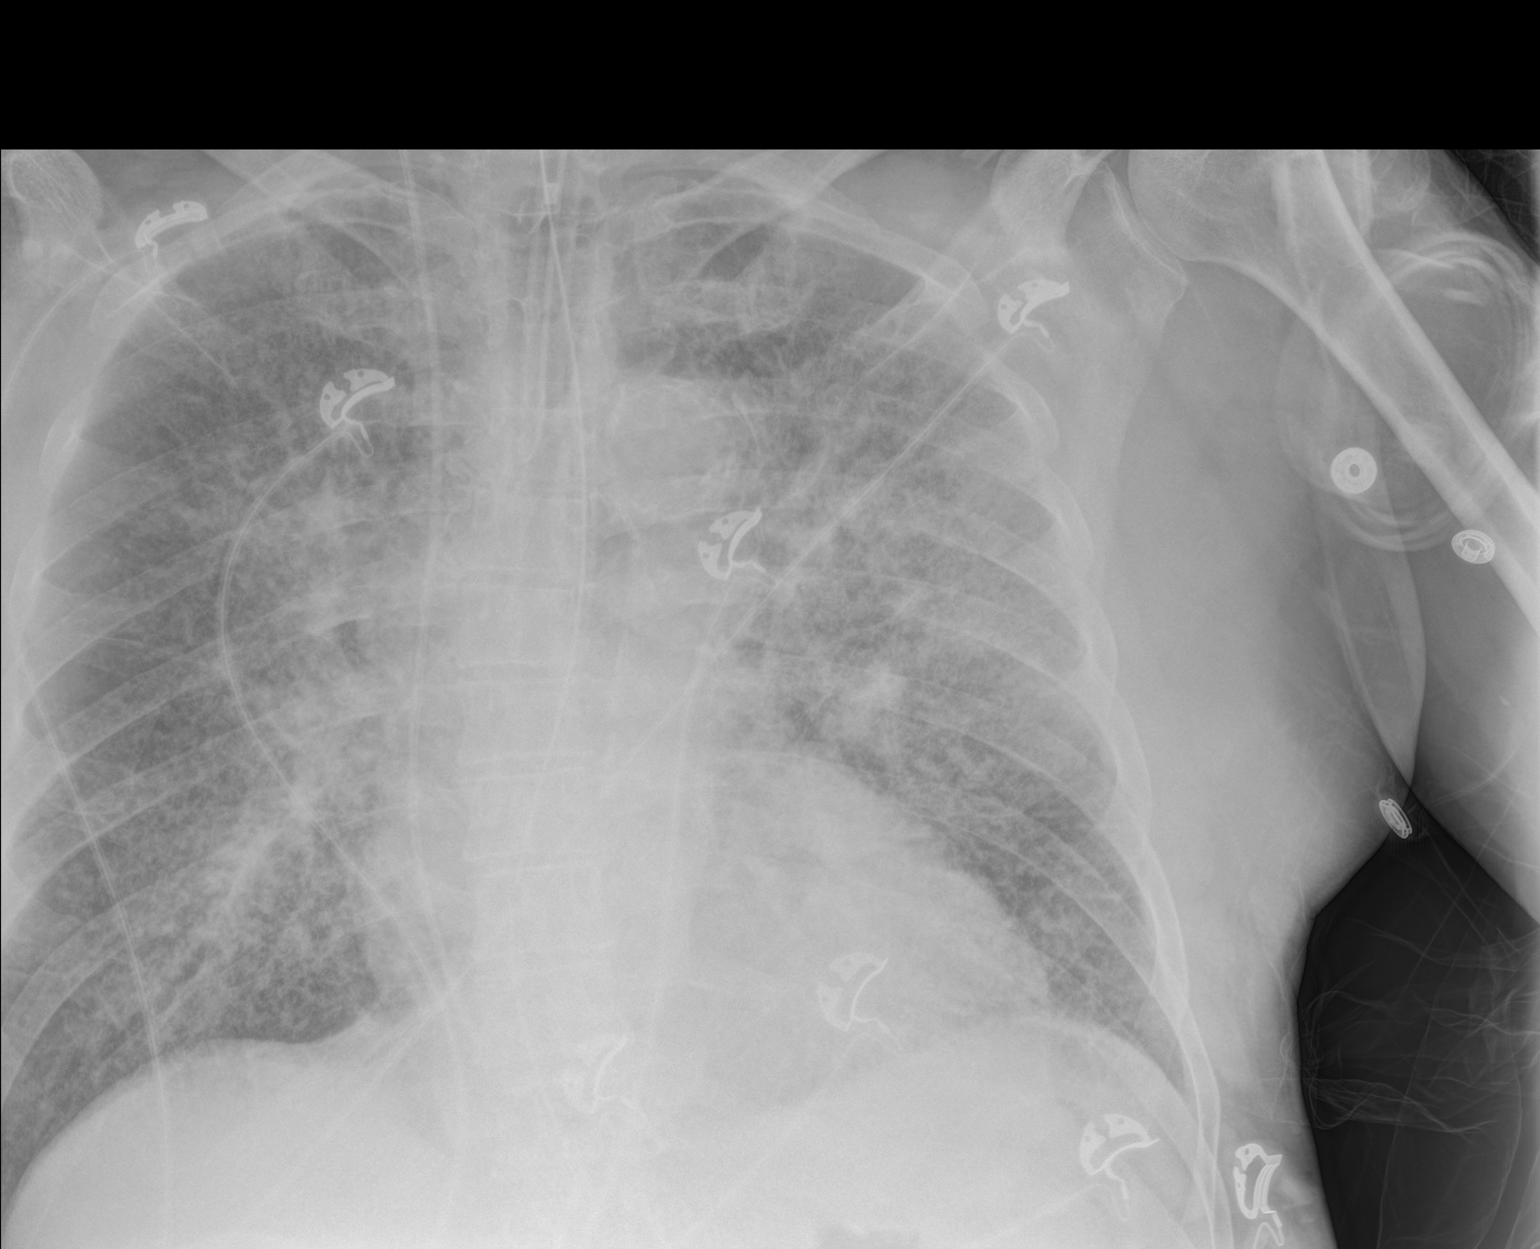

[chest ap (2 of 2)]
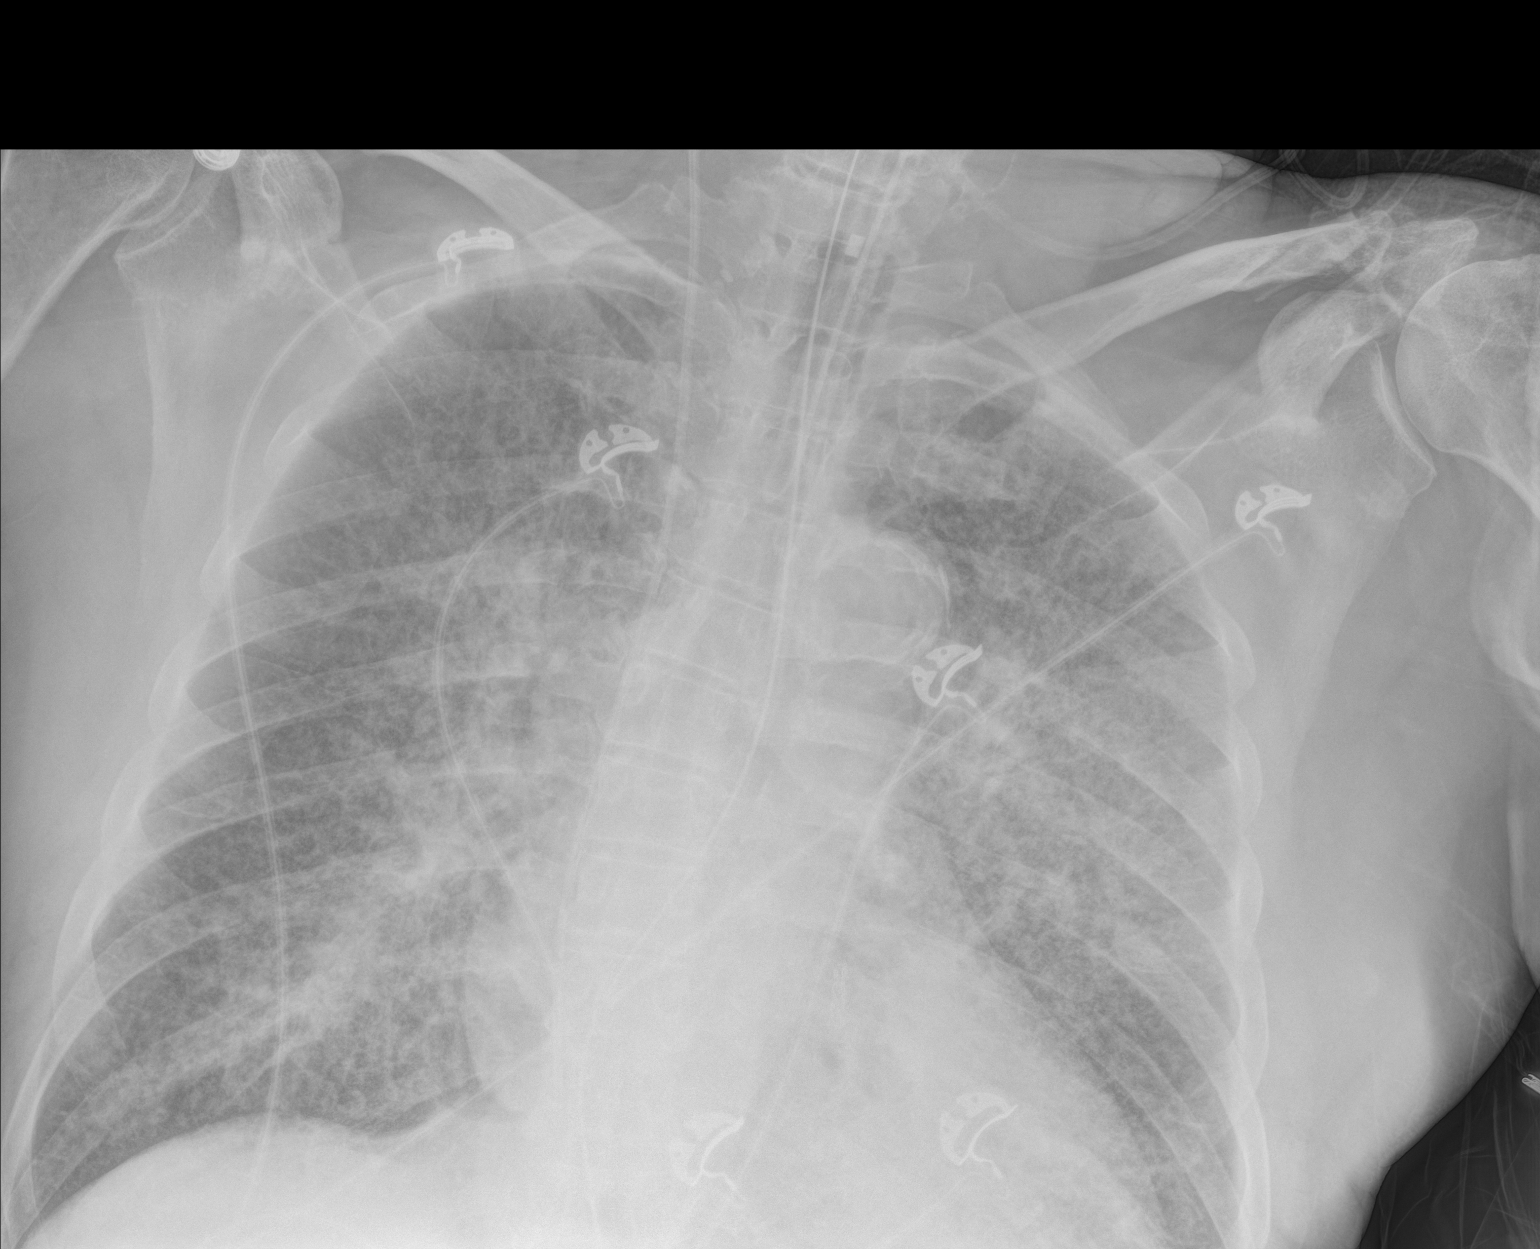

[2 of 2 positions shown; findings below may reference images not displayed]

FINDINGS: A new right jugular central venous catheter is seen with tip
overlying the inferior aspect of the right atrium, approximately 8
cm below the superior cavoatrial junction. A new nasogastric tube is
seen entering the stomach. Endotracheal tube remains in appropriate
position. No pneumothorax visualized.

Moderate diffuse pulmonary interstitial and airspace disease is seen
which is symmetric most likely due to diffuse pulmonary edema. Mild
cardiomegaly remains stable.
IMPRESSION: New right jugular central venous catheter tip overlies the inferior
aspect of the right atrium, approximately 8 cm below the superior
cavoatrial junction. No pneumothorax visualized.

Stable moderate diffuse pulmonary edema pattern.

## 2016-05-28 IMAGING — DX DG ABD PORTABLE 1V
1 series · 1 of 1 positions shown · non-contrast
Comparison: None.

CLINICAL DATA: NG tube placement

EXAM:
PORTABLE ABDOMEN - 1 VIEW

[abdomen kub]
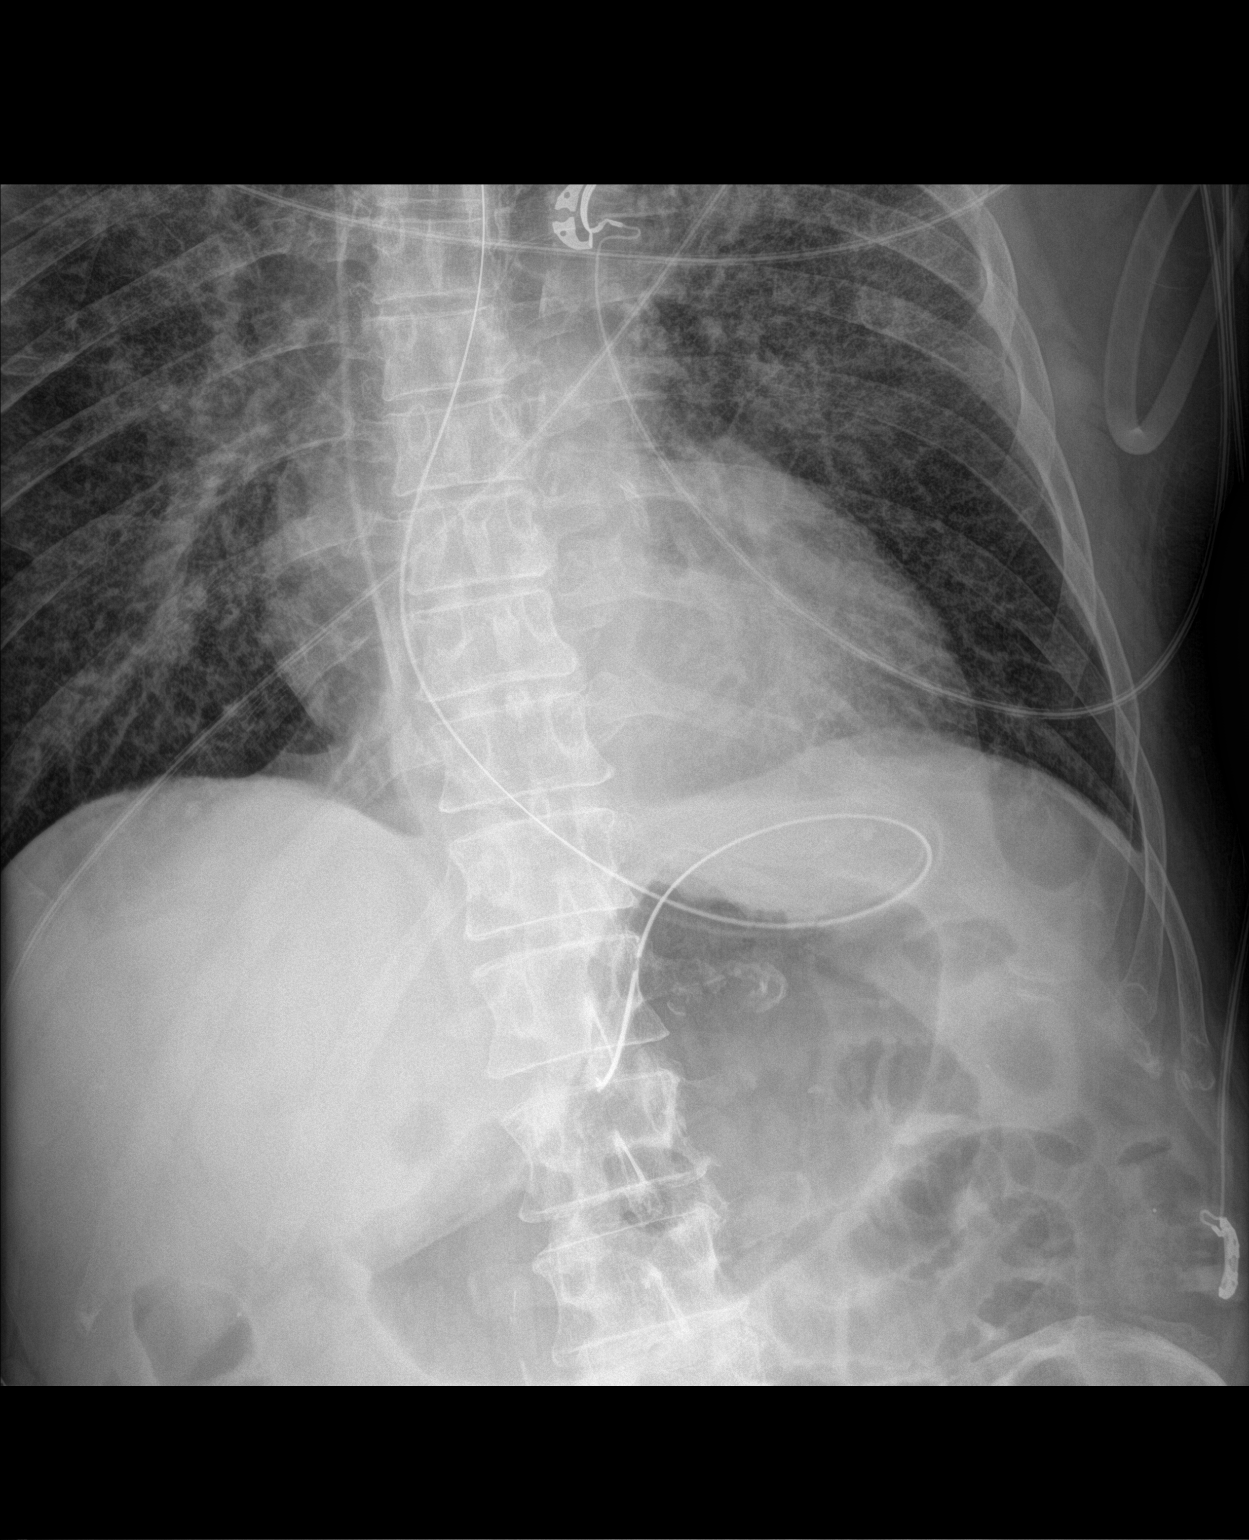

[1 of 1 positions shown; findings below may reference images not displayed]

FINDINGS: There is NG-tube coiled within proximal stomach with tip in mid
stomach. Mild gaseous distended small bowel loops in left abdomen.
IMPRESSION: NG tube coiled within proximal stomach with tip in mid stomach.

## 2016-05-30 ENCOUNTER — Ambulatory Visit: Payer: Self-pay | Admitting: Internal Medicine

## 2016-05-30 IMAGING — DX DG CHEST 1V PORT
1 series · 1 of 1 positions shown · non-contrast
Comparison: 05/06/2016

CLINICAL DATA: Shortness of Breath

EXAM:
PORTABLE CHEST 1 VIEW

[chest ap]
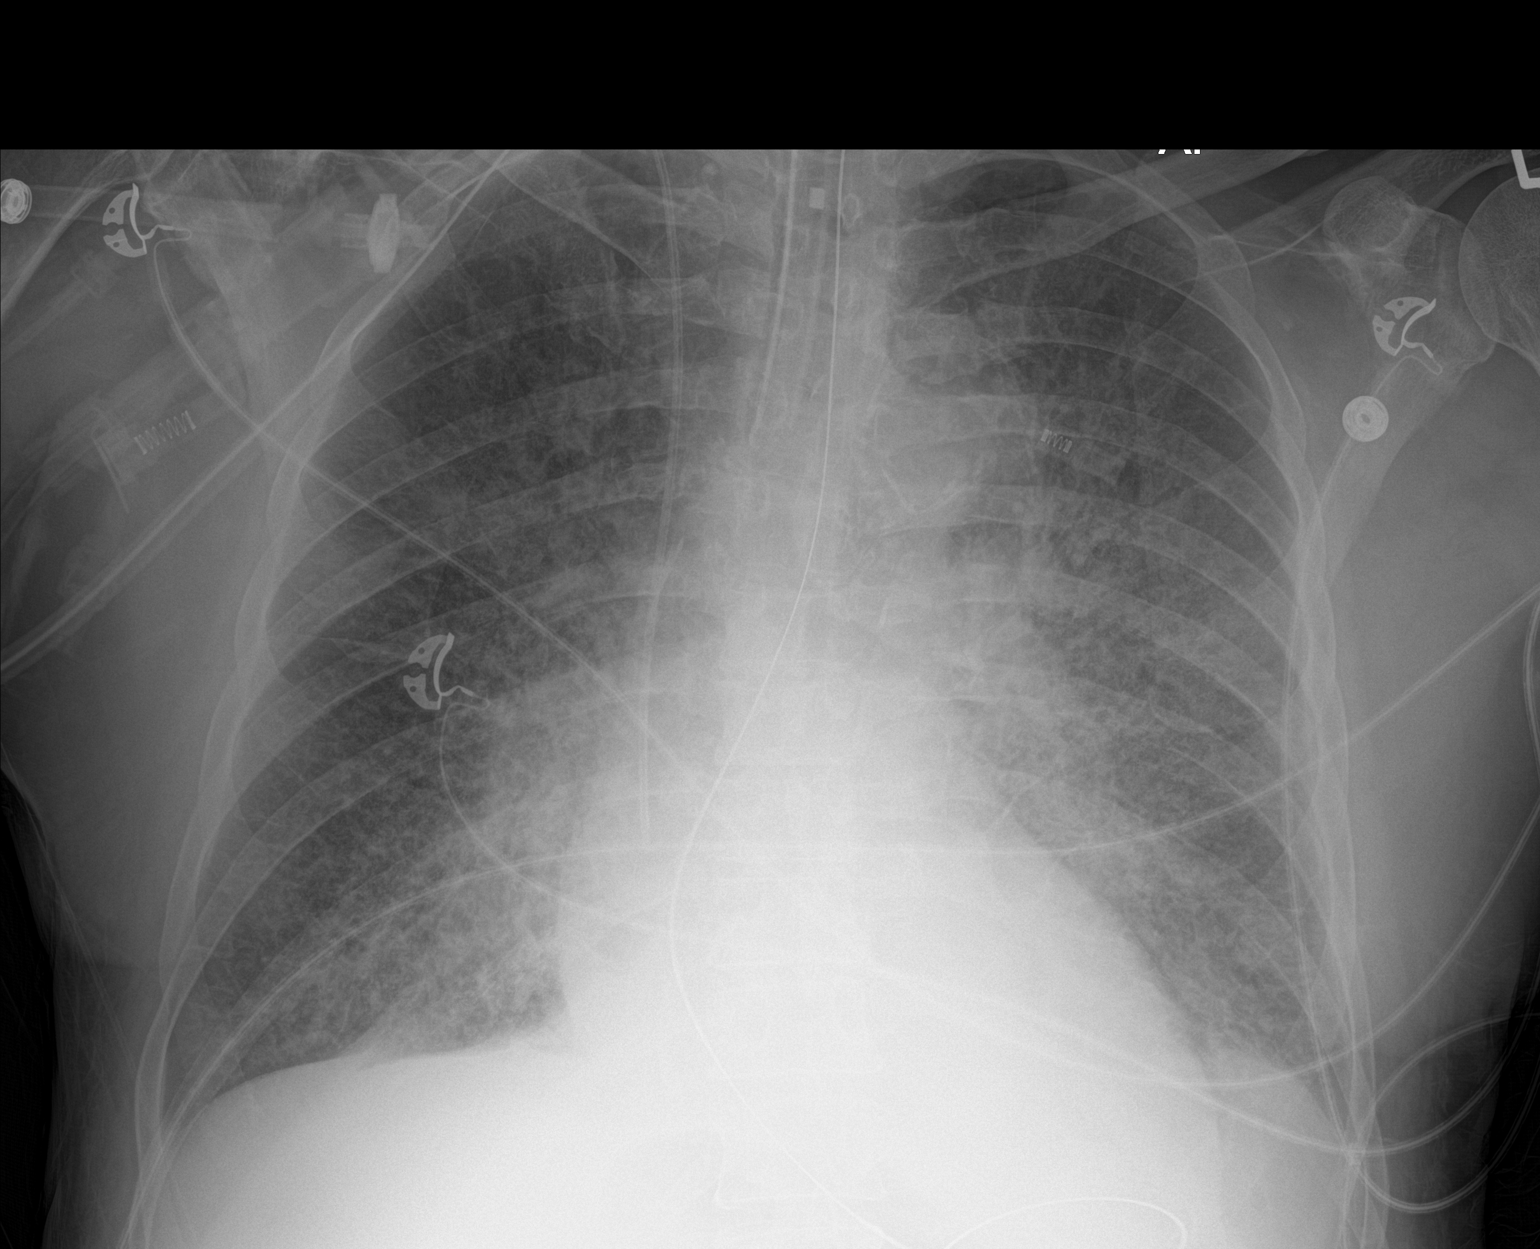

[1 of 1 positions shown; findings below may reference images not displayed]

FINDINGS: Cardiomediastinal silhouette is stable. Stable endotracheal and NG
tube position. Right IJ central line is unchanged in position. Again
noted bilateral perihilar airspace opacification and mild
interstitial prominence bilaterally consistent with pulmonary edema.
No segmental infiltrates.
IMPRESSION: Stable support apparatus. Bilateral pulmonary edema without change
in aeration.

## 2016-06-03 IMAGING — CR DG CHEST 1V PORT
1 series · 1 of 1 positions shown · non-contrast
Comparison: Portable chest x-ray May 10, 2016 and May 05, 2016.

CLINICAL DATA: Acute respiratory failure, community-acquired
pneumonia, current smoker.

EXAM:
PORTABLE CHEST 1 VIEW

[AP]
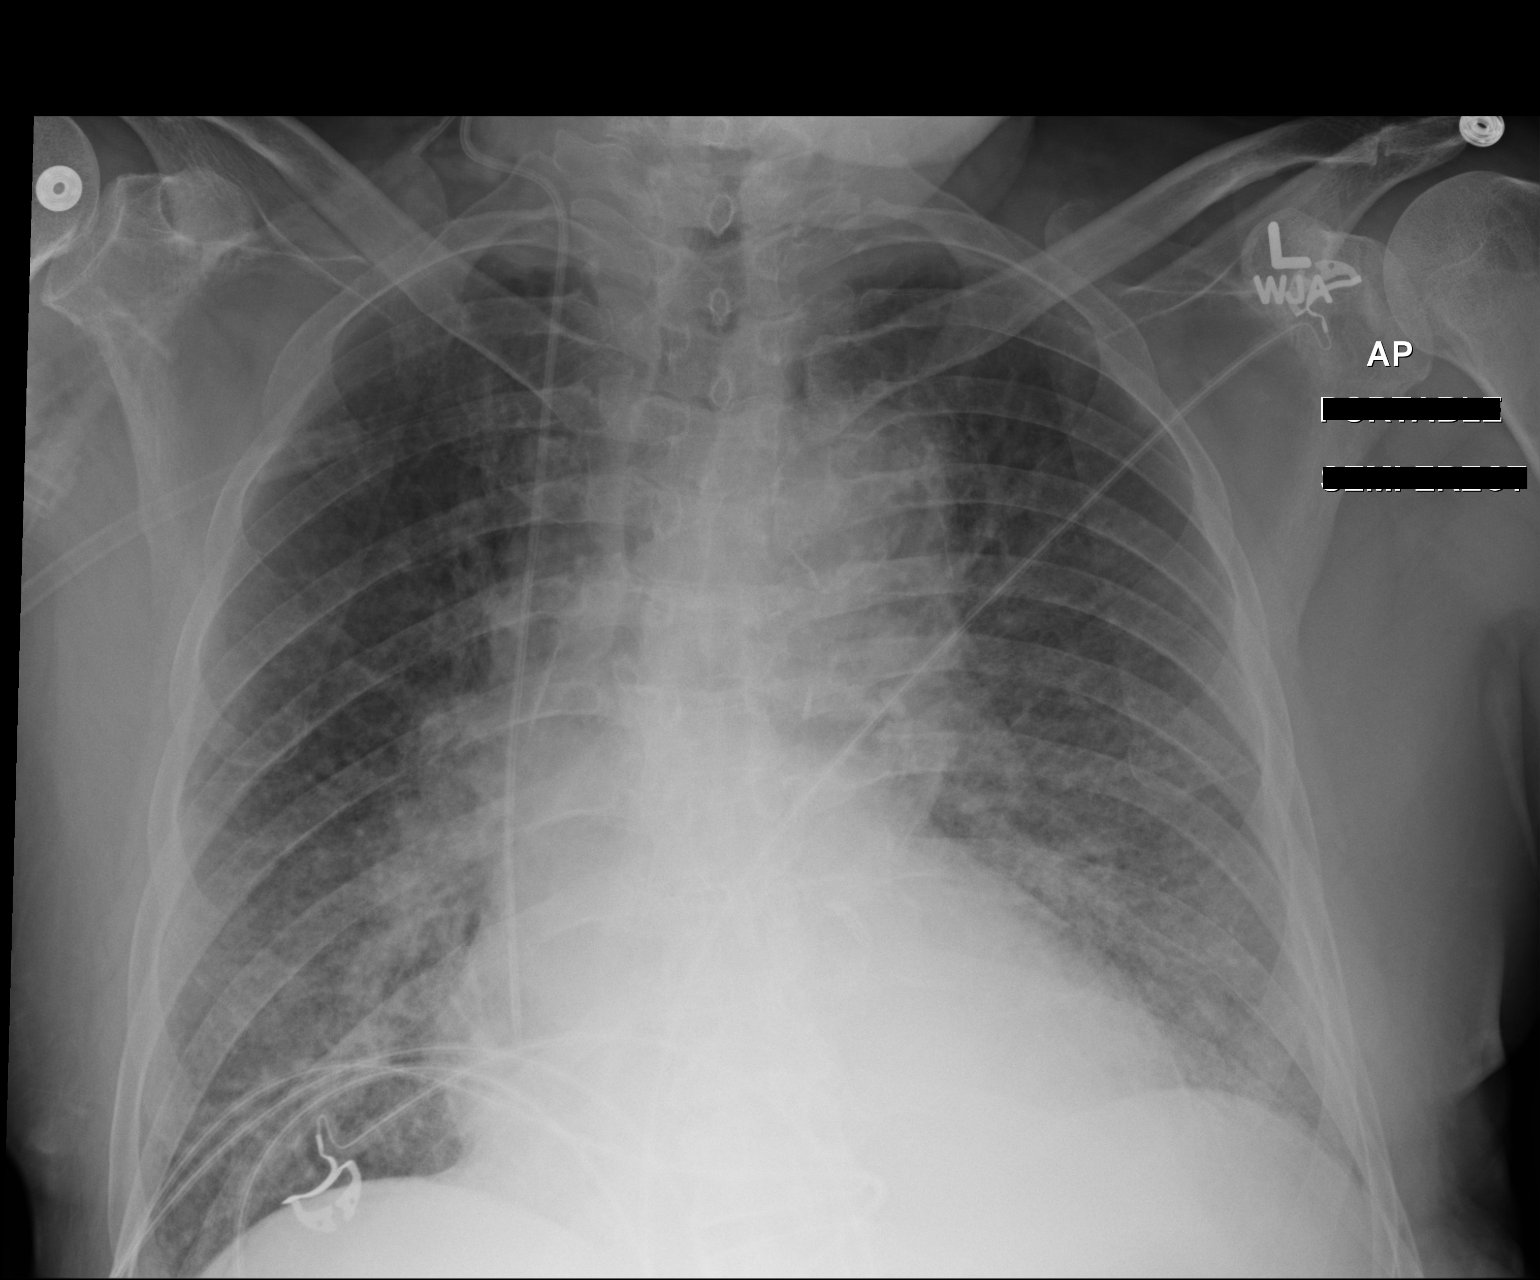

[1 of 1 positions shown; findings below may reference images not displayed]

FINDINGS: The lungs are well-expanded. There is no alveolar infiltrate. The
interstitial markings remain diffusely increased but are slightly
less conspicuous today. The cardiac silhouette remains enlarged. The
pulmonary vascularity remains engorged and indistinct. The
mediastinum is widened but stable. The right internal jugular venous
catheter tip projects over the cavoatrial junction.
IMPRESSION: Slight interval decrease in pulmonary interstitial edema. Stable
cardiomegaly. Stable prominence of the mediastinum.

## 2016-06-06 DIAGNOSIS — J309 Allergic rhinitis, unspecified: Secondary | ICD-10-CM | POA: Diagnosis not present

## 2016-06-06 DIAGNOSIS — J189 Pneumonia, unspecified organism: Secondary | ICD-10-CM | POA: Diagnosis not present

## 2016-06-06 DIAGNOSIS — I5021 Acute systolic (congestive) heart failure: Secondary | ICD-10-CM | POA: Diagnosis not present

## 2016-06-06 DIAGNOSIS — E785 Hyperlipidemia, unspecified: Secondary | ICD-10-CM | POA: Diagnosis not present

## 2016-06-06 DIAGNOSIS — I214 Non-ST elevation (NSTEMI) myocardial infarction: Secondary | ICD-10-CM | POA: Diagnosis not present

## 2016-06-06 DIAGNOSIS — Z720 Tobacco use: Secondary | ICD-10-CM | POA: Diagnosis not present

## 2016-06-08 LAB — HIV ANTIBODY (ROUTINE TESTING W REFLEX)
HIV SCREEN 4TH GENERATION: NONREACTIVE
HIV Screen 4th Generation wRfx: NONREACTIVE

## 2016-07-19 DIAGNOSIS — I119 Hypertensive heart disease without heart failure: Secondary | ICD-10-CM | POA: Diagnosis not present

## 2016-07-19 DIAGNOSIS — I472 Ventricular tachycardia: Secondary | ICD-10-CM | POA: Diagnosis not present

## 2016-07-19 DIAGNOSIS — I255 Ischemic cardiomyopathy: Secondary | ICD-10-CM | POA: Diagnosis not present

## 2016-07-19 DIAGNOSIS — I2510 Atherosclerotic heart disease of native coronary artery without angina pectoris: Secondary | ICD-10-CM | POA: Diagnosis not present

## 2016-07-19 DIAGNOSIS — E785 Hyperlipidemia, unspecified: Secondary | ICD-10-CM | POA: Diagnosis not present

## 2016-07-19 DIAGNOSIS — Z716 Tobacco abuse counseling: Secondary | ICD-10-CM | POA: Diagnosis not present

## 2016-07-19 DIAGNOSIS — I252 Old myocardial infarction: Secondary | ICD-10-CM | POA: Diagnosis not present

## 2016-07-19 DIAGNOSIS — F17290 Nicotine dependence, other tobacco product, uncomplicated: Secondary | ICD-10-CM | POA: Diagnosis not present

## 2016-07-19 DIAGNOSIS — Z720 Tobacco use: Secondary | ICD-10-CM | POA: Diagnosis not present

## 2016-07-20 DIAGNOSIS — E785 Hyperlipidemia, unspecified: Secondary | ICD-10-CM | POA: Diagnosis not present

## 2016-07-20 DIAGNOSIS — I252 Old myocardial infarction: Secondary | ICD-10-CM | POA: Diagnosis not present

## 2016-07-20 DIAGNOSIS — I119 Hypertensive heart disease without heart failure: Secondary | ICD-10-CM | POA: Diagnosis not present

## 2016-07-20 DIAGNOSIS — I2510 Atherosclerotic heart disease of native coronary artery without angina pectoris: Secondary | ICD-10-CM | POA: Diagnosis not present

## 2016-07-20 DIAGNOSIS — I10 Essential (primary) hypertension: Secondary | ICD-10-CM | POA: Diagnosis not present

## 2016-07-20 DIAGNOSIS — I255 Ischemic cardiomyopathy: Secondary | ICD-10-CM | POA: Diagnosis not present

## 2016-07-20 DIAGNOSIS — I5020 Unspecified systolic (congestive) heart failure: Secondary | ICD-10-CM | POA: Diagnosis not present

## 2016-09-07 DIAGNOSIS — I252 Old myocardial infarction: Secondary | ICD-10-CM | POA: Diagnosis not present

## 2016-09-07 DIAGNOSIS — Z720 Tobacco use: Secondary | ICD-10-CM | POA: Diagnosis not present

## 2016-09-07 DIAGNOSIS — I5021 Acute systolic (congestive) heart failure: Secondary | ICD-10-CM | POA: Diagnosis not present

## 2016-09-07 DIAGNOSIS — Z23 Encounter for immunization: Secondary | ICD-10-CM | POA: Diagnosis not present

## 2016-09-07 DIAGNOSIS — M21961 Unspecified acquired deformity of right lower leg: Secondary | ICD-10-CM | POA: Diagnosis not present

## 2016-09-07 DIAGNOSIS — Z1389 Encounter for screening for other disorder: Secondary | ICD-10-CM | POA: Diagnosis not present

## 2016-09-07 DIAGNOSIS — J309 Allergic rhinitis, unspecified: Secondary | ICD-10-CM | POA: Diagnosis not present

## 2016-09-07 DIAGNOSIS — Z8701 Personal history of pneumonia (recurrent): Secondary | ICD-10-CM | POA: Diagnosis not present

## 2016-09-07 DIAGNOSIS — E785 Hyperlipidemia, unspecified: Secondary | ICD-10-CM | POA: Diagnosis not present

## 2016-09-14 ENCOUNTER — Other Ambulatory Visit: Payer: Self-pay | Admitting: Family Medicine

## 2016-09-14 ENCOUNTER — Ambulatory Visit
Admission: RE | Admit: 2016-09-14 | Discharge: 2016-09-14 | Disposition: A | Discharge status: Home / Self Care | Payer: Medicare Other | Source: Ambulatory Visit | Attending: Family Medicine | Admitting: Family Medicine

## 2016-09-14 DIAGNOSIS — I5021 Acute systolic (congestive) heart failure: Secondary | ICD-10-CM | POA: Diagnosis not present

## 2016-09-14 DIAGNOSIS — M542 Cervicalgia: Secondary | ICD-10-CM | POA: Diagnosis not present

## 2016-09-14 DIAGNOSIS — M21961 Unspecified acquired deformity of right lower leg: Secondary | ICD-10-CM | POA: Diagnosis not present

## 2016-09-14 DIAGNOSIS — N183 Chronic kidney disease, stage 3 (moderate): Secondary | ICD-10-CM | POA: Diagnosis not present

## 2016-10-20 DIAGNOSIS — I119 Hypertensive heart disease without heart failure: Secondary | ICD-10-CM | POA: Diagnosis not present

## 2016-10-20 DIAGNOSIS — I2510 Atherosclerotic heart disease of native coronary artery without angina pectoris: Secondary | ICD-10-CM | POA: Diagnosis not present

## 2016-10-20 DIAGNOSIS — E785 Hyperlipidemia, unspecified: Secondary | ICD-10-CM | POA: Diagnosis not present

## 2016-10-20 DIAGNOSIS — I255 Ischemic cardiomyopathy: Secondary | ICD-10-CM | POA: Diagnosis not present

## 2016-10-20 DIAGNOSIS — F17290 Nicotine dependence, other tobacco product, uncomplicated: Secondary | ICD-10-CM | POA: Diagnosis not present

## 2016-10-20 DIAGNOSIS — I252 Old myocardial infarction: Secondary | ICD-10-CM | POA: Diagnosis not present

## 2016-12-26 DIAGNOSIS — Z8701 Personal history of pneumonia (recurrent): Secondary | ICD-10-CM | POA: Diagnosis not present

## 2016-12-26 DIAGNOSIS — E785 Hyperlipidemia, unspecified: Secondary | ICD-10-CM | POA: Diagnosis not present

## 2016-12-26 DIAGNOSIS — M21961 Unspecified acquired deformity of right lower leg: Secondary | ICD-10-CM | POA: Diagnosis not present

## 2016-12-26 DIAGNOSIS — R1013 Epigastric pain: Secondary | ICD-10-CM | POA: Diagnosis not present

## 2016-12-26 DIAGNOSIS — N183 Chronic kidney disease, stage 3 (moderate): Secondary | ICD-10-CM | POA: Diagnosis not present

## 2016-12-26 DIAGNOSIS — I5021 Acute systolic (congestive) heart failure: Secondary | ICD-10-CM | POA: Diagnosis not present

## 2016-12-26 DIAGNOSIS — J309 Allergic rhinitis, unspecified: Secondary | ICD-10-CM | POA: Diagnosis not present

## 2016-12-26 DIAGNOSIS — L299 Pruritus, unspecified: Secondary | ICD-10-CM | POA: Diagnosis not present

## 2016-12-26 DIAGNOSIS — M542 Cervicalgia: Secondary | ICD-10-CM | POA: Diagnosis not present

## 2017-01-26 DIAGNOSIS — N189 Chronic kidney disease, unspecified: Secondary | ICD-10-CM | POA: Diagnosis not present

## 2017-01-26 DIAGNOSIS — I252 Old myocardial infarction: Secondary | ICD-10-CM | POA: Diagnosis not present

## 2017-01-26 DIAGNOSIS — I2510 Atherosclerotic heart disease of native coronary artery without angina pectoris: Secondary | ICD-10-CM | POA: Diagnosis not present

## 2017-01-26 DIAGNOSIS — I255 Ischemic cardiomyopathy: Secondary | ICD-10-CM | POA: Diagnosis not present

## 2017-01-26 DIAGNOSIS — E785 Hyperlipidemia, unspecified: Secondary | ICD-10-CM | POA: Diagnosis not present

## 2017-01-26 DIAGNOSIS — F17290 Nicotine dependence, other tobacco product, uncomplicated: Secondary | ICD-10-CM | POA: Diagnosis not present

## 2017-01-26 DIAGNOSIS — J029 Acute pharyngitis, unspecified: Secondary | ICD-10-CM | POA: Diagnosis not present

## 2017-01-26 DIAGNOSIS — I1310 Hypertensive heart and chronic kidney disease without heart failure, with stage 1 through stage 4 chronic kidney disease, or unspecified chronic kidney disease: Secondary | ICD-10-CM | POA: Diagnosis not present

## 2017-03-19 DIAGNOSIS — M1711 Unilateral primary osteoarthritis, right knee: Secondary | ICD-10-CM | POA: Diagnosis not present

## 2017-03-19 DIAGNOSIS — M79671 Pain in right foot: Secondary | ICD-10-CM | POA: Diagnosis not present

## 2017-03-19 DIAGNOSIS — M25551 Pain in right hip: Secondary | ICD-10-CM | POA: Diagnosis not present

## 2017-03-26 ENCOUNTER — Ambulatory Visit
Admission: RE | Admit: 2017-03-26 | Discharge: 2017-03-26 | Disposition: A | Discharge status: Home / Self Care | Payer: PRIVATE HEALTH INSURANCE | Source: Ambulatory Visit | Attending: Family Medicine | Admitting: Family Medicine

## 2017-03-26 ENCOUNTER — Other Ambulatory Visit: Payer: Self-pay | Admitting: Family Medicine

## 2017-03-26 DIAGNOSIS — R05 Cough: Secondary | ICD-10-CM | POA: Diagnosis not present

## 2017-03-26 DIAGNOSIS — J309 Allergic rhinitis, unspecified: Secondary | ICD-10-CM | POA: Diagnosis not present

## 2017-03-26 DIAGNOSIS — M25561 Pain in right knee: Secondary | ICD-10-CM | POA: Diagnosis not present

## 2017-03-26 DIAGNOSIS — R262 Difficulty in walking, not elsewhere classified: Secondary | ICD-10-CM | POA: Diagnosis not present

## 2017-03-26 DIAGNOSIS — R059 Cough, unspecified: Secondary | ICD-10-CM

## 2017-03-26 DIAGNOSIS — M50322 Other cervical disc degeneration at C5-C6 level: Secondary | ICD-10-CM | POA: Diagnosis not present

## 2017-03-26 DIAGNOSIS — E7800 Pure hypercholesterolemia, unspecified: Secondary | ICD-10-CM | POA: Diagnosis not present

## 2017-03-26 DIAGNOSIS — N183 Chronic kidney disease, stage 3 (moderate): Secondary | ICD-10-CM | POA: Diagnosis not present

## 2017-03-26 DIAGNOSIS — I5021 Acute systolic (congestive) heart failure: Secondary | ICD-10-CM | POA: Diagnosis not present

## 2017-03-26 DIAGNOSIS — M21961 Unspecified acquired deformity of right lower leg: Secondary | ICD-10-CM | POA: Diagnosis not present

## 2017-03-26 DIAGNOSIS — M542 Cervicalgia: Secondary | ICD-10-CM | POA: Diagnosis not present

## 2017-03-26 DIAGNOSIS — Z8701 Personal history of pneumonia (recurrent): Secondary | ICD-10-CM | POA: Diagnosis not present

## 2017-03-29 DIAGNOSIS — M25561 Pain in right knee: Secondary | ICD-10-CM | POA: Diagnosis not present

## 2017-03-29 DIAGNOSIS — M542 Cervicalgia: Secondary | ICD-10-CM | POA: Diagnosis not present

## 2017-03-29 DIAGNOSIS — R262 Difficulty in walking, not elsewhere classified: Secondary | ICD-10-CM | POA: Diagnosis not present

## 2017-04-03 DIAGNOSIS — R262 Difficulty in walking, not elsewhere classified: Secondary | ICD-10-CM | POA: Diagnosis not present

## 2017-04-03 DIAGNOSIS — M542 Cervicalgia: Secondary | ICD-10-CM | POA: Diagnosis not present

## 2017-04-03 DIAGNOSIS — M25561 Pain in right knee: Secondary | ICD-10-CM | POA: Diagnosis not present

## 2017-04-05 DIAGNOSIS — R262 Difficulty in walking, not elsewhere classified: Secondary | ICD-10-CM | POA: Diagnosis not present

## 2017-04-05 DIAGNOSIS — M542 Cervicalgia: Secondary | ICD-10-CM | POA: Diagnosis not present

## 2017-04-05 DIAGNOSIS — M25561 Pain in right knee: Secondary | ICD-10-CM | POA: Diagnosis not present

## 2017-04-10 DIAGNOSIS — M25561 Pain in right knee: Secondary | ICD-10-CM | POA: Diagnosis not present

## 2017-04-10 DIAGNOSIS — M542 Cervicalgia: Secondary | ICD-10-CM | POA: Diagnosis not present

## 2017-04-10 DIAGNOSIS — R262 Difficulty in walking, not elsewhere classified: Secondary | ICD-10-CM | POA: Diagnosis not present

## 2017-04-12 DIAGNOSIS — R262 Difficulty in walking, not elsewhere classified: Secondary | ICD-10-CM | POA: Diagnosis not present

## 2017-04-12 DIAGNOSIS — M25561 Pain in right knee: Secondary | ICD-10-CM | POA: Diagnosis not present

## 2017-04-12 DIAGNOSIS — M542 Cervicalgia: Secondary | ICD-10-CM | POA: Diagnosis not present

## 2017-04-16 DIAGNOSIS — M542 Cervicalgia: Secondary | ICD-10-CM | POA: Diagnosis not present

## 2017-04-16 DIAGNOSIS — M7501 Adhesive capsulitis of right shoulder: Secondary | ICD-10-CM | POA: Diagnosis not present

## 2017-04-16 DIAGNOSIS — M25561 Pain in right knee: Secondary | ICD-10-CM | POA: Diagnosis not present

## 2017-04-16 DIAGNOSIS — R262 Difficulty in walking, not elsewhere classified: Secondary | ICD-10-CM | POA: Diagnosis not present

## 2017-04-27 ENCOUNTER — Other Ambulatory Visit: Payer: Self-pay | Admitting: Cardiology

## 2017-04-27 DIAGNOSIS — I1310 Hypertensive heart and chronic kidney disease without heart failure, with stage 1 through stage 4 chronic kidney disease, or unspecified chronic kidney disease: Secondary | ICD-10-CM | POA: Diagnosis not present

## 2017-04-27 DIAGNOSIS — I2510 Atherosclerotic heart disease of native coronary artery without angina pectoris: Secondary | ICD-10-CM | POA: Diagnosis not present

## 2017-04-27 DIAGNOSIS — R0609 Other forms of dyspnea: Secondary | ICD-10-CM | POA: Diagnosis not present

## 2017-04-27 DIAGNOSIS — I252 Old myocardial infarction: Secondary | ICD-10-CM | POA: Diagnosis not present

## 2017-04-27 DIAGNOSIS — Z716 Tobacco abuse counseling: Secondary | ICD-10-CM | POA: Diagnosis not present

## 2017-04-27 DIAGNOSIS — N189 Chronic kidney disease, unspecified: Secondary | ICD-10-CM | POA: Diagnosis not present

## 2017-04-27 DIAGNOSIS — R079 Chest pain, unspecified: Secondary | ICD-10-CM

## 2017-04-27 DIAGNOSIS — F17290 Nicotine dependence, other tobacco product, uncomplicated: Secondary | ICD-10-CM | POA: Diagnosis not present

## 2017-04-27 DIAGNOSIS — Z720 Tobacco use: Secondary | ICD-10-CM | POA: Diagnosis not present

## 2017-04-27 DIAGNOSIS — I255 Ischemic cardiomyopathy: Secondary | ICD-10-CM | POA: Diagnosis not present

## 2017-05-18 ENCOUNTER — Ambulatory Visit (HOSPITAL_COMMUNITY)
Admission: RE | Admit: 2017-05-18 | Discharge: 2017-05-18 | Disposition: A | Discharge status: Home / Self Care | Payer: Medicare HMO | Source: Ambulatory Visit | Attending: Cardiology | Admitting: Cardiology

## 2017-05-18 DIAGNOSIS — I255 Ischemic cardiomyopathy: Secondary | ICD-10-CM | POA: Diagnosis not present

## 2017-05-18 DIAGNOSIS — I2510 Atherosclerotic heart disease of native coronary artery without angina pectoris: Secondary | ICD-10-CM | POA: Diagnosis not present

## 2017-05-18 DIAGNOSIS — Q899 Congenital malformation, unspecified: Secondary | ICD-10-CM | POA: Diagnosis not present

## 2017-05-18 DIAGNOSIS — R079 Chest pain, unspecified: Secondary | ICD-10-CM | POA: Diagnosis not present

## 2017-05-18 DIAGNOSIS — N189 Chronic kidney disease, unspecified: Secondary | ICD-10-CM | POA: Diagnosis not present

## 2017-05-18 DIAGNOSIS — I252 Old myocardial infarction: Secondary | ICD-10-CM | POA: Diagnosis not present

## 2017-05-18 DIAGNOSIS — F17290 Nicotine dependence, other tobacco product, uncomplicated: Secondary | ICD-10-CM | POA: Diagnosis not present

## 2017-05-18 DIAGNOSIS — R0609 Other forms of dyspnea: Secondary | ICD-10-CM | POA: Diagnosis not present

## 2017-05-18 DIAGNOSIS — I517 Cardiomegaly: Secondary | ICD-10-CM | POA: Insufficient documentation

## 2017-05-18 DIAGNOSIS — E785 Hyperlipidemia, unspecified: Secondary | ICD-10-CM | POA: Diagnosis not present

## 2017-05-18 DIAGNOSIS — I639 Cerebral infarction, unspecified: Secondary | ICD-10-CM | POA: Diagnosis not present

## 2017-05-18 DIAGNOSIS — I1310 Hypertensive heart and chronic kidney disease without heart failure, with stage 1 through stage 4 chronic kidney disease, or unspecified chronic kidney disease: Secondary | ICD-10-CM | POA: Diagnosis not present

## 2017-05-18 LAB — BASIC METABOLIC PANEL
ANION GAP: 6 (ref 5–15)
BUN: 28 mg/dL — ABNORMAL HIGH (ref 6–20)
CHLORIDE: 106 mmol/L (ref 101–111)
CO2: 26 mmol/L (ref 22–32)
Calcium: 9.3 mg/dL (ref 8.9–10.3)
Creatinine, Ser: 1.5 mg/dL — ABNORMAL HIGH (ref 0.61–1.24)
GFR calc Af Amer: 53 mL/min — ABNORMAL LOW (ref >60)
GFR calc non Af Amer: 45 mL/min — ABNORMAL LOW (ref >60)
GLUCOSE: 112 mg/dL — AB (ref 65–99)
POTASSIUM: 4.5 mmol/L (ref 3.5–5.1)
Sodium: 138 mmol/L (ref 135–145)

## 2017-05-18 LAB — LIPID PANEL
Cholesterol: 117 mg/dL (ref 0–200)
HDL: 26 mg/dL — ABNORMAL LOW (ref >40)
LDL CALC: 62 mg/dL (ref 0–99)
Total CHOL/HDL Ratio: 4.5 RATIO
Triglycerides: 145 mg/dL (ref <150)
VLDL: 29 mg/dL (ref 0–40)

## 2017-05-18 LAB — HEPATIC FUNCTION PANEL
ALBUMIN: 4 g/dL (ref 3.5–5.0)
ALT: 16 U/L — AB (ref 17–63)
AST: 15 U/L (ref 15–41)
Alkaline Phosphatase: 66 U/L (ref 38–126)
BILIRUBIN TOTAL: 0.6 mg/dL (ref 0.3–1.2)
Total Protein: 7 g/dL (ref 6.5–8.1)

## 2017-05-18 MED ORDER — TECHNETIUM TC 99M TETROFOSMIN IV KIT
30.0000 | PACK | Freq: Once | INTRAVENOUS | Status: AC | PRN
  Administered 2017-05-18: 30 via INTRAVENOUS

## 2017-05-18 MED ORDER — TECHNETIUM TC 99M TETROFOSMIN IV KIT
10.0000 | PACK | Freq: Once | INTRAVENOUS | Status: AC | PRN
  Administered 2017-05-18: 10 via INTRAVENOUS

## 2017-05-18 MED ORDER — AMINOPHYLLINE 25 MG/ML IV SOLN
INTRAVENOUS | Status: AC
  Filled 2017-05-18: qty 10

## 2017-05-18 MED ORDER — REGADENOSON 0.4 MG/5ML IV SOLN
0.4000 mg | Freq: Once | INTRAVENOUS | Status: AC
  Administered 2017-05-18: 0.4 mg via INTRAVENOUS

## 2017-05-18 MED ORDER — REGADENOSON 0.4 MG/5ML IV SOLN
INTRAVENOUS | Status: AC
  Filled 2017-05-18: qty 5

## 2017-05-24 DIAGNOSIS — I255 Ischemic cardiomyopathy: Secondary | ICD-10-CM | POA: Diagnosis not present

## 2017-05-24 DIAGNOSIS — E785 Hyperlipidemia, unspecified: Secondary | ICD-10-CM | POA: Diagnosis not present

## 2017-05-24 DIAGNOSIS — I2510 Atherosclerotic heart disease of native coronary artery without angina pectoris: Secondary | ICD-10-CM | POA: Diagnosis not present

## 2017-05-24 DIAGNOSIS — M1990 Unspecified osteoarthritis, unspecified site: Secondary | ICD-10-CM | POA: Diagnosis not present

## 2017-05-24 DIAGNOSIS — N189 Chronic kidney disease, unspecified: Secondary | ICD-10-CM | POA: Diagnosis not present

## 2017-05-24 DIAGNOSIS — I1310 Hypertensive heart and chronic kidney disease without heart failure, with stage 1 through stage 4 chronic kidney disease, or unspecified chronic kidney disease: Secondary | ICD-10-CM | POA: Diagnosis not present

## 2017-05-24 DIAGNOSIS — F17290 Nicotine dependence, other tobacco product, uncomplicated: Secondary | ICD-10-CM | POA: Diagnosis not present

## 2017-05-24 DIAGNOSIS — R0609 Other forms of dyspnea: Secondary | ICD-10-CM | POA: Diagnosis not present

## 2017-05-24 DIAGNOSIS — I639 Cerebral infarction, unspecified: Secondary | ICD-10-CM | POA: Diagnosis not present

## 2017-05-25 DIAGNOSIS — Z125 Encounter for screening for malignant neoplasm of prostate: Secondary | ICD-10-CM | POA: Diagnosis not present

## 2017-05-25 DIAGNOSIS — J309 Allergic rhinitis, unspecified: Secondary | ICD-10-CM | POA: Diagnosis not present

## 2017-05-25 DIAGNOSIS — Z0000 Encounter for general adult medical examination without abnormal findings: Secondary | ICD-10-CM | POA: Diagnosis not present

## 2017-05-25 DIAGNOSIS — I5022 Chronic systolic (congestive) heart failure: Secondary | ICD-10-CM | POA: Diagnosis not present

## 2017-05-25 DIAGNOSIS — N183 Chronic kidney disease, stage 3 (moderate): Secondary | ICD-10-CM | POA: Diagnosis not present

## 2017-05-25 DIAGNOSIS — I252 Old myocardial infarction: Secondary | ICD-10-CM | POA: Diagnosis not present

## 2017-05-25 DIAGNOSIS — M50322 Other cervical disc degeneration at C5-C6 level: Secondary | ICD-10-CM | POA: Diagnosis not present

## 2017-05-25 DIAGNOSIS — Z1159 Encounter for screening for other viral diseases: Secondary | ICD-10-CM | POA: Diagnosis not present

## 2017-05-25 DIAGNOSIS — M21961 Unspecified acquired deformity of right lower leg: Secondary | ICD-10-CM | POA: Diagnosis not present

## 2017-05-25 DIAGNOSIS — E7800 Pure hypercholesterolemia, unspecified: Secondary | ICD-10-CM | POA: Diagnosis not present

## 2017-05-25 DIAGNOSIS — Z8701 Personal history of pneumonia (recurrent): Secondary | ICD-10-CM | POA: Diagnosis not present

## 2017-05-28 ENCOUNTER — Other Ambulatory Visit: Payer: Self-pay | Admitting: Family Medicine

## 2017-05-28 DIAGNOSIS — Z136 Encounter for screening for cardiovascular disorders: Secondary | ICD-10-CM

## 2017-06-05 ENCOUNTER — Ambulatory Visit
Admission: RE | Admit: 2017-06-05 | Discharge: 2017-06-05 | Disposition: A | Discharge status: Home / Self Care | Payer: Medicare HMO | Source: Ambulatory Visit | Attending: Family Medicine | Admitting: Family Medicine

## 2017-06-05 ENCOUNTER — Other Ambulatory Visit: Payer: Self-pay | Admitting: Family Medicine

## 2017-06-05 DIAGNOSIS — Z136 Encounter for screening for cardiovascular disorders: Secondary | ICD-10-CM

## 2017-06-05 DIAGNOSIS — Z87891 Personal history of nicotine dependence: Secondary | ICD-10-CM | POA: Diagnosis not present

## 2017-06-07 DIAGNOSIS — I10 Essential (primary) hypertension: Secondary | ICD-10-CM | POA: Diagnosis not present

## 2017-06-07 DIAGNOSIS — I2510 Atherosclerotic heart disease of native coronary artery without angina pectoris: Secondary | ICD-10-CM | POA: Diagnosis not present

## 2017-06-09 DIAGNOSIS — N189 Chronic kidney disease, unspecified: Secondary | ICD-10-CM | POA: Diagnosis not present

## 2017-06-09 DIAGNOSIS — I2510 Atherosclerotic heart disease of native coronary artery without angina pectoris: Secondary | ICD-10-CM | POA: Diagnosis not present

## 2017-06-09 DIAGNOSIS — I255 Ischemic cardiomyopathy: Secondary | ICD-10-CM | POA: Diagnosis not present

## 2017-06-09 DIAGNOSIS — R0609 Other forms of dyspnea: Secondary | ICD-10-CM | POA: Diagnosis not present

## 2017-06-09 DIAGNOSIS — I639 Cerebral infarction, unspecified: Secondary | ICD-10-CM | POA: Diagnosis not present

## 2017-06-09 DIAGNOSIS — I1310 Hypertensive heart and chronic kidney disease without heart failure, with stage 1 through stage 4 chronic kidney disease, or unspecified chronic kidney disease: Secondary | ICD-10-CM | POA: Diagnosis not present

## 2017-06-09 DIAGNOSIS — E785 Hyperlipidemia, unspecified: Secondary | ICD-10-CM | POA: Diagnosis not present

## 2017-06-18 ENCOUNTER — Observation Stay (HOSPITAL_COMMUNITY)
Admission: EM | Admit: 2017-06-18 | Discharge: 2017-06-19 | Disposition: A | Discharge status: Home / Self Care | Payer: Medicare HMO | Attending: Cardiology | Admitting: Cardiology

## 2017-06-18 ENCOUNTER — Emergency Department (HOSPITAL_COMMUNITY): Payer: Medicare HMO

## 2017-06-18 ENCOUNTER — Encounter (HOSPITAL_COMMUNITY): Payer: Self-pay

## 2017-06-18 DIAGNOSIS — R0789 Other chest pain: Secondary | ICD-10-CM | POA: Diagnosis not present

## 2017-06-18 DIAGNOSIS — E785 Hyperlipidemia, unspecified: Secondary | ICD-10-CM | POA: Insufficient documentation

## 2017-06-18 DIAGNOSIS — I252 Old myocardial infarction: Secondary | ICD-10-CM | POA: Insufficient documentation

## 2017-06-18 DIAGNOSIS — Z8673 Personal history of transient ischemic attack (TIA), and cerebral infarction without residual deficits: Secondary | ICD-10-CM | POA: Diagnosis not present

## 2017-06-18 DIAGNOSIS — Z79899 Other long term (current) drug therapy: Secondary | ICD-10-CM | POA: Insufficient documentation

## 2017-06-18 DIAGNOSIS — I130 Hypertensive heart and chronic kidney disease with heart failure and stage 1 through stage 4 chronic kidney disease, or unspecified chronic kidney disease: Secondary | ICD-10-CM | POA: Insufficient documentation

## 2017-06-18 DIAGNOSIS — Z7982 Long term (current) use of aspirin: Secondary | ICD-10-CM | POA: Insufficient documentation

## 2017-06-18 DIAGNOSIS — N183 Chronic kidney disease, stage 3 (moderate): Secondary | ICD-10-CM | POA: Insufficient documentation

## 2017-06-18 DIAGNOSIS — I200 Unstable angina: Secondary | ICD-10-CM

## 2017-06-18 DIAGNOSIS — Z8679 Personal history of other diseases of the circulatory system: Secondary | ICD-10-CM | POA: Insufficient documentation

## 2017-06-18 DIAGNOSIS — I255 Ischemic cardiomyopathy: Secondary | ICD-10-CM | POA: Insufficient documentation

## 2017-06-18 DIAGNOSIS — I700 Atherosclerosis of aorta: Secondary | ICD-10-CM | POA: Diagnosis not present

## 2017-06-18 DIAGNOSIS — M1990 Unspecified osteoarthritis, unspecified site: Secondary | ICD-10-CM | POA: Diagnosis not present

## 2017-06-18 DIAGNOSIS — R079 Chest pain, unspecified: Secondary | ICD-10-CM

## 2017-06-18 DIAGNOSIS — I25118 Atherosclerotic heart disease of native coronary artery with other forms of angina pectoris: Secondary | ICD-10-CM | POA: Diagnosis not present

## 2017-06-18 DIAGNOSIS — I5022 Chronic systolic (congestive) heart failure: Secondary | ICD-10-CM | POA: Insufficient documentation

## 2017-06-18 DIAGNOSIS — F17210 Nicotine dependence, cigarettes, uncomplicated: Secondary | ICD-10-CM | POA: Insufficient documentation

## 2017-06-18 HISTORY — DX: Acute myocardial infarction, unspecified: I21.9

## 2017-06-18 HISTORY — DX: Heart failure, unspecified: I50.9

## 2017-06-18 HISTORY — DX: Unstable angina: I20.0

## 2017-06-18 LAB — CBC
HCT: 43 % (ref 39.0–52.0)
Hemoglobin: 14.5 g/dL (ref 13.0–17.0)
MCH: 32.8 pg (ref 26.0–34.0)
MCHC: 33.7 g/dL (ref 30.0–36.0)
MCV: 97.3 fL (ref 78.0–100.0)
PLATELETS: 173 10*3/uL (ref 150–400)
RBC: 4.42 MIL/uL (ref 4.22–5.81)
RDW: 13.7 % (ref 11.5–15.5)
WBC: 7.5 10*3/uL (ref 4.0–10.5)

## 2017-06-18 LAB — BASIC METABOLIC PANEL
Anion gap: 8 (ref 5–15)
BUN: 32 mg/dL — AB (ref 6–20)
CALCIUM: 9.6 mg/dL (ref 8.9–10.3)
CO2: 24 mmol/L (ref 22–32)
CREATININE: 1.72 mg/dL — AB (ref 0.61–1.24)
Chloride: 106 mmol/L (ref 101–111)
GFR, EST AFRICAN AMERICAN: 45 mL/min — AB (ref >60)
GFR, EST NON AFRICAN AMERICAN: 39 mL/min — AB (ref >60)
Glucose, Bld: 111 mg/dL — ABNORMAL HIGH (ref 65–99)
Potassium: 5 mmol/L (ref 3.5–5.1)
SODIUM: 138 mmol/L (ref 135–145)

## 2017-06-18 LAB — I-STAT TROPONIN, ED: TROPONIN I, POC: 0.01 ng/mL (ref 0.00–0.08)

## 2017-06-18 LAB — TROPONIN I: Troponin I: <0.03 ng/mL

## 2017-06-18 LAB — MAGNESIUM: Magnesium: 2 mg/dL (ref 1.7–2.4)

## 2017-06-18 MED ORDER — NITROGLYCERIN 0.4 MG SL SUBL
0.4000 mg | SUBLINGUAL_TABLET | SUBLINGUAL | Status: DC | PRN
Start: 2017-06-18 — End: 2017-06-19
  Filled 2017-06-18: qty 1

## 2017-06-18 MED ORDER — NITROGLYCERIN 0.4 MG SL SUBL
0.4000 mg | SUBLINGUAL_TABLET | Freq: Once | SUBLINGUAL | Status: AC
  Administered 2017-06-18: 0.4 mg via SUBLINGUAL

## 2017-06-18 MED ORDER — FUROSEMIDE 40 MG PO TABS
40.0000 mg | ORAL_TABLET | Freq: Every day | ORAL | Status: DC
  Administered 2017-06-19: 40 mg via ORAL
  Filled 2017-06-18: qty 1

## 2017-06-18 MED ORDER — NITROGLYCERIN 2 % TD OINT
1.0000 [in_us] | TOPICAL_OINTMENT | Freq: Four times a day (QID) | TRANSDERMAL | Status: DC
  Administered 2017-06-18 – 2017-06-19 (×3): 1 [in_us] via TOPICAL
  Filled 2017-06-18: qty 30

## 2017-06-18 MED ORDER — SPIRONOLACTONE 12.5 MG HALF TABLET
12.5000 mg | ORAL_TABLET | Freq: Every day | ORAL | Status: DC
  Administered 2017-06-19: 12.5 mg via ORAL
  Filled 2017-06-18: qty 1

## 2017-06-18 MED ORDER — CARVEDILOL 6.25 MG PO TABS
6.2500 mg | ORAL_TABLET | Freq: Two times a day (BID) | ORAL | Status: DC
  Administered 2017-06-18 – 2017-06-19 (×2): 6.25 mg via ORAL
  Filled 2017-06-18 (×2): qty 1

## 2017-06-18 MED ORDER — AMIODARONE HCL 200 MG PO TABS: 200.0000 mg | ORAL_TABLET | Freq: Every day | ORAL | Status: DC

## 2017-06-18 MED ORDER — ASPIRIN 81 MG PO CHEW: 324.0000 mg | CHEWABLE_TABLET | ORAL | Status: DC

## 2017-06-18 MED ORDER — ACETAMINOPHEN 325 MG PO TABS
650.0000 mg | ORAL_TABLET | ORAL | Status: DC | PRN
  Administered 2017-06-19: 650 mg via ORAL
  Filled 2017-06-18: qty 2

## 2017-06-18 MED ORDER — ONDANSETRON HCL 4 MG/2ML IJ SOLN: 4.0000 mg | Freq: Four times a day (QID) | INTRAMUSCULAR | Status: DC | PRN

## 2017-06-18 MED ORDER — NITROGLYCERIN 2 % TD OINT
1.0000 [in_us] | TOPICAL_OINTMENT | Freq: Once | TRANSDERMAL | Status: AC
  Administered 2017-06-18: 1 [in_us] via TOPICAL
  Filled 2017-06-18: qty 1

## 2017-06-18 MED ORDER — HEPARIN (PORCINE) IN NACL 100-0.45 UNIT/ML-% IJ SOLN
1000.0000 [IU]/h | INTRAMUSCULAR | Status: DC
  Administered 2017-06-18: 1000 [IU]/h via INTRAVENOUS
  Filled 2017-06-18: qty 250

## 2017-06-18 MED ORDER — HEPARIN BOLUS VIA INFUSION
4000.0000 [IU] | Freq: Once | INTRAVENOUS | Status: AC
Start: 2017-06-18 — End: 2017-06-18
  Administered 2017-06-18: 4000 [IU] via INTRAVENOUS
  Filled 2017-06-18: qty 4000

## 2017-06-18 MED ORDER — SACUBITRIL-VALSARTAN 49-51 MG PO TABS
1.0000 | ORAL_TABLET | Freq: Two times a day (BID) | ORAL | Status: DC
  Administered 2017-06-18 – 2017-06-19 (×2): 1 via ORAL
  Filled 2017-06-18 (×2): qty 1

## 2017-06-18 MED ORDER — ADULT MULTIVITAMIN W/MINERALS CH
1.0000 | ORAL_TABLET | Freq: Every day | ORAL | Status: DC
  Administered 2017-06-19: 1 via ORAL
  Filled 2017-06-18: qty 1

## 2017-06-18 MED ORDER — ADULT MULTIVITAMIN W/MINERALS CH: 1.0000 | ORAL_TABLET | Freq: Every day | ORAL | Status: DC

## 2017-06-18 MED ORDER — ASPIRIN 81 MG PO CHEW
324.0000 mg | CHEWABLE_TABLET | Freq: Once | ORAL | Status: AC
  Administered 2017-06-18: 324 mg via ORAL
  Filled 2017-06-18: qty 4

## 2017-06-18 MED ORDER — AMIODARONE HCL 200 MG PO TABS
200.0000 mg | ORAL_TABLET | Freq: Every day | ORAL | Status: DC
  Administered 2017-06-19: 200 mg via ORAL
  Filled 2017-06-18: qty 1

## 2017-06-18 MED ORDER — FUROSEMIDE 40 MG PO TABS: 40.0000 mg | ORAL_TABLET | Freq: Every day | ORAL | Status: DC

## 2017-06-18 MED ORDER — ATORVASTATIN CALCIUM 80 MG PO TABS
80.0000 mg | ORAL_TABLET | Freq: Every day | ORAL | Status: DC
  Administered 2017-06-18: 80 mg via ORAL
  Filled 2017-06-18: qty 1

## 2017-06-18 MED ORDER — CLOPIDOGREL BISULFATE 75 MG PO TABS
75.0000 mg | ORAL_TABLET | Freq: Every day | ORAL | Status: DC
  Administered 2017-06-19: 75 mg via ORAL
  Filled 2017-06-18: qty 1

## 2017-06-18 MED ORDER — ASPIRIN 300 MG RE SUPP: 300.0000 mg | RECTAL | Status: DC

## 2017-06-18 MED ORDER — ASPIRIN 81 MG PO CHEW
81.0000 mg | CHEWABLE_TABLET | Freq: Every day | ORAL | Status: DC
  Administered 2017-06-19: 81 mg via ORAL
  Filled 2017-06-18: qty 1

## 2017-06-18 MED ORDER — ASPIRIN 81 MG PO CHEW: 81.0000 mg | CHEWABLE_TABLET | Freq: Every day | ORAL | Status: DC

## 2017-06-18 MED ORDER — SODIUM CHLORIDE 0.9 % IV SOLN: INTRAVENOUS | Status: DC

## 2017-06-18 MED ORDER — NITROGLYCERIN 0.4 MG SL SUBL
SUBLINGUAL_TABLET | SUBLINGUAL | Status: AC
  Filled 2017-06-18: qty 1

## 2017-06-18 NOTE — ED Provider Notes (Signed)
[facility] DEPT Provider Note   CSN: [id number] Arrival date & time: 06/18/17  1623     History   Chief Complaint Chief Complaint  Patient presents with  . Chest Pain    HPI Gregory Dyer is a 71 y.o. male.  71yo M w/ PMH including CAD s/p MI and stenting, HTN who p/w chest pain. At 3:30pm today while at rest, he began having central, nonradiating chest pressure that feels the same as previous heart attack. Pain is currently 4/10 in intensity. He took nitroglycerin at home which initially helped but the pain increased later. He has chronic SOB and chronic smoker's cough that have not changed recently. No associated N/V, diaphoresis, fevers, or recent illness.    Chest Pain      Past Medical History:  Diagnosis Date  . CAD (coronary artery disease)    stent in [place]  . Closed head injury    Fall 140' treated in [place]  . Heart failure ([place])   . Hypertension   . MI (myocardial infarction) ([place])   . Tobacco abuse     Patient Active Problem List   Diagnosis Date Noted  . Tobacco abuse   . Elevated troponin   . Hypertensive emergency   . NSTEMI (non-ST elevated myocardial infarction) ([place])   . Sepsis due to pneumonia ([place])   . Acute encephalopathy   . Hyperglycemia   . Acute respiratory failure with hypoxia ([place]) 05/06/2016  . CAP (community acquired pneumonia) 05/05/2016    Past Surgical History:  Procedure Laterality Date  . CARDIAC CATHETERIZATION N/A 05/08/2016   Procedure: Left Heart Cath and Coronary Angiography;  Surgeon: Adrian Prows, MD;  Location: [facility];  Service: Cardiovascular;  Laterality: N/A;  . HERNIA REPAIR    . stent         Home Medications    Prior to Admission medications   Medication Sig Start Date End Date Taking? Authorizing Provider  acetaminophen (TYLENOL) 650 MG CR tablet Take 650 mg by mouth every 8 (eight) hours as needed for pain.   Yes [provider]  amiodarone (PACERONE) 200 MG tablet Take 1  tablet (200 mg total) by mouth daily. 05/12/16  Yes Bonnielee Haff, MD  aspirin 81 MG chewable tablet Chew 1 tablet (81 mg total) by mouth daily. 05/12/16  Yes Bonnielee Haff, MD  atorvastatin (LIPITOR) 80 MG tablet Take 1 tablet (80 mg total) by mouth daily at 6 PM. 05/12/16  Yes Bonnielee Haff, MD  carvedilol (COREG) 6.25 MG tablet Take 6.25 mg by mouth 2 (two) times daily. 04/30/17  Yes [provider]  clopidogrel (PLAVIX) 75 MG tablet Take 1 tablet (75 mg total) by mouth daily with breakfast. 05/12/16  Yes Bonnielee Haff, MD  furosemide (LASIX) 40 MG tablet Take 1 tablet (40 mg total) by mouth daily. 05/12/16  Yes Bonnielee Haff, MD  isosorbide mononitrate (IMDUR) 30 MG 24 hr tablet Take 1 tablet (30 mg total) by mouth daily. 05/12/16  Yes Bonnielee Haff, MD  Multiple Vitamin (MULTIVITAMIN WITH MINERALS) TABS tablet Take 1 tablet by mouth daily.   Yes [provider]  sacubitril-valsartan (ENTRESTO) 97-103 MG Take 1 tablet by mouth 2 (two) times daily.   Yes [provider]  spironolactone (ALDACTONE) 25 MG tablet Take 0.5 tablets (12.5 mg total) by mouth daily. 05/12/16  Yes Bonnielee Haff, MD  albuterol (PROVENTIL HFA;VENTOLIN HFA) 108 (90 Base) MCG/ACT inhaler Inhale 1 puff into the lungs 2 (two) times daily as needed for wheezing  or shortness of breath. Patient not taking: Reported on 05/18/2017 05/12/16   Bonnielee Haff, MD  sacubitril-valsartan (ENTRESTO) 24-26 MG Take 1 tablet by mouth 2 (two) times daily. Patient not taking: Reported on 06/18/2017 05/12/16   Bonnielee Haff, MD    Family History No family history on file.  Social History Social History  Substance Use Topics  . Smoking status: Current Every Day Smoker    Packs/day: 1.00    Years: 50.00    Types: Cigarettes  . Smokeless tobacco: Never Used  . Alcohol use 8.4 oz/week    14 Shots of liquor per week     Comment: drinks 2 glasses of Jim Beam and coke per day     Allergies   No known  allergies   Review of Systems Review of Systems  Cardiovascular: Positive for chest pain.   All other systems reviewed and are negative except that which was mentioned in HPI   Physical Exam Updated Vital Signs BP 128/67   Pulse (!) 56   Temp 98.6 F (37 C) (Oral)   Resp 15   Wt 83 kg (183 lb)   SpO2 98%   BMI 27.83 kg/m   Physical Exam  Constitutional: He is oriented to person, place, and time. He appears well-developed and well-nourished. No distress.  HENT:  Head: Normocephalic and atraumatic.  Moist mucous membranes  Eyes: Conjunctivae are normal. Pupils are equal, round, and reactive to light.  Neck: Neck supple.  Cardiovascular: Normal rate, regular rhythm and normal heart sounds.   No murmur heard. Pulmonary/Chest: Effort normal.  Occasional expiratory wheeze  Abdominal: Soft. Bowel sounds are normal. He exhibits no distension. There is no tenderness.  Musculoskeletal: He exhibits no edema.  Neurological: He is alert and oriented to person, place, and time.  Fluent speech  Skin: Skin is warm and dry.  Psychiatric: Judgment normal.  Mildly anxious  Nursing note and vitals reviewed.    ED Treatments / Results  Labs (all labs ordered are listed, but only abnormal results are displayed) Labs Reviewed  BASIC METABOLIC PANEL - Abnormal; Notable for the following:       Result Value   Glucose, Bld 111 (*)    BUN 32 (*)    Creatinine, Ser 1.72 (*)    GFR calc non Af Amer 39 (*)    GFR calc Af Amer 45 (*)    All other components within normal limits  CBC  I-STAT TROPOININ, ED    EKG  EKG Interpretation  Date/Time:  Monday June 18 2017 16:28:52 EDT Ventricular Rate:  64 PR Interval:  216 QRS Duration: 106 QT Interval:  420 QTC Calculation: 433 R Axis:   59 Text Interpretation:  Sinus rhythm with 1st degree A-V block Possible Inferior infarct , age undetermined ST & T wave abnormality, consider lateral ischemia Abnormal ECG new T wave inversions in  inferior leads, T wave inversions in V5-V6 similar to previous Confirmed by Theotis Burrow [phone]) on 06/18/2017 4:41:31 PM       Radiology Dg Chest 2 View  Result Date: 06/18/2017 CLINICAL DATA:  71 year old male with history of lower middle chest pain for the past 3-4 hours. Some associated lightheadedness. EXAM: CHEST  2 VIEW COMPARISON:  Chest x-ray a 05/11/2016. FINDINGS: Lung volumes are normal. No consolidative airspace disease. No pleural effusions. No pneumothorax. No pulmonary nodule or mass noted. Pulmonary vasculature and the cardiomediastinal silhouette are within normal limits. Atherosclerosis in the thoracic aorta. IMPRESSION: 1.  No radiographic evidence  of acute cardiopulmonary disease. 2. Aortic atherosclerosis Aortic Atherosclerosis (ICD10-I70.0). Electronically Signed   By: Vinnie Langton M.D.   On: 06/18/2017 17:30    Procedures Procedures (including critical care time)  Medications Ordered in ED Medications  nitroGLYCERIN (NITROSTAT) 0.4 MG SL tablet (not administered)  aspirin chewable tablet 324 mg (324 mg Oral Given 06/18/17 1649)  nitroGLYCERIN (NITROSTAT) SL tablet 0.4 mg (0.4 mg Sublingual Given 06/18/17 1647)  nitroGLYCERIN (NITROGLYN) 2 % ointment 1 inch (1 inch Topical Given 06/18/17 1808)     Initial Impression / Assessment and Plan / ED Course  I have reviewed the triage vital signs and the nursing notes.  Pertinent labs & imaging results that were available during my care of the patient were reviewed by me and considered in my medical decision making (see chart for details).    PT w/ central chest tightness that began earlier today, improves with nitroglycerin but returns. On exam, he was mildly hypertensive, no acute distress. O2 sat 100% on room air. His EKG showed T-wave inversions inferiorly that appear new compared to previous. Gave 325 mg aspirin and sublingual nitroglycerin. The nitroglycerin briefly improved his pain but he reported that it  returned, therefore placed nitroglycerin paste. Initial troponin negative, chest x-ray negative acute. No sudden ripping or tearing chest pain and no radiation to his back to suggest aortic dissection. He denies any recent travel, leg swelling/pain, or history of blood clots to suggest PE. Contacted Dr. Terrence Dupont given his cardiac hx, he will admit pt for further care.  Final Clinical Impressions(s) / ED Diagnoses   Final diagnoses:  Chest pain, unspecified type    New Prescriptions New Prescriptions   No medications on file     Little, Wenda Overland, MD 06/18/17 1843

## 2017-06-18 NOTE — H&P (Signed)
Levent Kornegay is an 71 y.o. male.   Chief Complaint: Recurrent chest pain HPI: Patient is 71 year old male with past medical history significant for multivessel coronary artery disease status post non-Q-wave myocardial infarction in the past noted to have occluded RCA and left circumflex, ischemic cardiomyopathy EF approximately 30-35%, history of nonsustained VT, history of congestive heart failure secondary to depressed LV systolic function, hypertension, hyperlipidemia, chronic kidney disease stage III, tobacco abuse, history of CVA, UA came to the ER complaining of retrosternal chest pain described as pressure grade 5/10 without associated nausea vomiting or diaphoresis took 2 sublingual nitroglycerin 5 minutes apart initially with relief of chest pain again had recurrent chest pain requiring one sublingual nitroglycerin with partial relief so decided to come to ED. Patient denies any palpitation lightheadedness or syncope. Denies PND orthopnea leg swelling. Patient received sublingual nitroglycerin and was placed on Nitropaste in the ED with relief of chest pain. First set of troponin I is negative EKG done in the ED showed normal sinus rhythm with ST-T wave changes in inferolateral leads. Patient recently had nuclear stress test which showed inferolateral wall scarring with no evidence of ischemia with EF of 32%.  Past Medical History:  Diagnosis Date  . CAD (coronary artery disease)    stent in [place]  . Closed head injury    Fall 140' treated in [place]  . Heart failure ([place])   . Hypertension   . MI (myocardial infarction) ([place])   . Tobacco abuse     Past Surgical History:  Procedure Laterality Date  . CARDIAC CATHETERIZATION N/A 05/08/2016   Procedure: Left Heart Cath and Coronary Angiography;  Surgeon: Adrian Prows, MD;  Location: [facility];  Service: Cardiovascular;  Laterality: N/A;  . HERNIA REPAIR    . stent      No family history on file. Social History:  reports that  he has been smoking Cigarettes.  He has a 50.00 pack-year smoking history. He has never used smokeless tobacco. He reports that he drinks about 8.4 oz of alcohol per week . He reports that he does not use drugs.  Allergies:  Allergies  Allergen Reactions  . No Known Allergies      (Not in a hospital admission)  Results for orders placed or performed during the hospital encounter of 06/18/17 (from the past 48 hour(s))  Basic metabolic panel     Status: Abnormal   Collection Time: 06/18/17  4:35 PM  Result Value Ref Range   Sodium 138 135 - 145 mmol/L   Potassium 5.0 3.5 - 5.1 mmol/L   [place] 106 101 - 111 mmol/L   CO2 24 22 - 32 mmol/L   Glucose, Bld 111 (H) 65 - 99 mg/dL   BUN 32 (H) 6 - 20 mg/dL   Creatinine, Ser 1.72 (H) 0.61 - 1.24 mg/dL   Calcium 9.6 8.9 - 10.3 mg/dL   GFR calc non Af Amer 39 (L) >60 mL/min   GFR calc Af Amer 45 (L) >60 mL/min    Comment: (NOTE) The eGFR has been calculated using the CKD EPI equation. This calculation has not been validated in all clinical situations. eGFR's persistently <60 mL/min signify possible Chronic Kidney Disease.    Anion gap 8 5 - 15  CBC     Status: None   Collection Time: 06/18/17  4:35 PM  Result Value Ref Range   WBC 7.5 4.0 - 10.5 K/uL   RBC 4.42 4.22 - 5.81 MIL/uL   Hemoglobin 14.5 13.0 -  17.0 g/dL   HCT 43.0 39.0 - 52.0 %   MCV 97.3 78.0 - 100.0 fL   MCH 32.8 26.0 - 34.0 pg   MCHC 33.7 30.0 - 36.0 g/dL   RDW 13.7 11.5 - 15.5 %   Platelets 173 150 - 400 K/uL  I-stat troponin, ED     Status: None   Collection Time: 06/18/17  5:00 PM  Result Value Ref Range   Troponin i, poc 0.01 0.00 - 0.08 ng/mL   Comment 3            Comment: Due to the release kinetics of cTnI, a negative result within the first hours of the onset of symptoms does not rule out myocardial infarction with certainty. If myocardial infarction is still suspected, repeat the test at appropriate intervals.    Dg Chest 2 View  Result Date:  06/18/2017 CLINICAL DATA:  72 year old male with history of lower middle chest pain for the past 3-4 hours. Some associated lightheadedness. EXAM: CHEST  2 VIEW COMPARISON:  Chest x-ray a 05/11/2016. FINDINGS: Lung volumes are normal. No consolidative airspace disease. No pleural effusions. No pneumothorax. No pulmonary nodule or mass noted. Pulmonary vasculature and the cardiomediastinal silhouette are within normal limits. Atherosclerosis in the thoracic aorta. IMPRESSION: 1.  No radiographic evidence of acute cardiopulmonary disease. 2. Aortic atherosclerosis Aortic Atherosclerosis (ICD10-I70.0). Electronically Signed   By: Vinnie Langton M.D.   On: 06/18/2017 17:30    Review of Systems  Constitutional: Negative for fever.  HENT: Negative for sore throat.   Respiratory: Positive for cough. Negative for shortness of breath.   Cardiovascular: Positive for chest pain. Negative for palpitations and orthopnea.  Gastrointestinal: Negative for nausea and vomiting.  Genitourinary: Negative for dysuria.  Skin: Negative for rash.  Neurological: Negative for dizziness.    Blood pressure 128/67, pulse (!) 56, temperature 98.6 F (37 C), temperature source Oral, resp. rate 15, weight 83 kg (183 lb), SpO2 98 %. Physical Exam  Constitutional: He is oriented to person, place, and time.  HENT:  Head: Normocephalic and atraumatic.  Eyes: Conjunctivae are normal. Pupils are equal, round, and reactive to light. Left eye exhibits no discharge.  Neck: Normal range of motion. Neck supple. No JVD present. No tracheal deviation present. No thyromegaly present.  Cardiovascular: Normal rate and regular rhythm.   Murmur (Soft systolic murmur noted) heard. Respiratory: Effort normal and breath sounds normal. No respiratory distress. He has no wheezes. He has no rales.  GI: Soft. Bowel sounds are normal.  Musculoskeletal: He exhibits no edema, tenderness or deformity.  Neurological: He is alert and oriented to  person, place, and time.     Assessment/Plan Unstable angina rule out MI Multivessel CAD history of non-Q-wave MI in the past with occluded chronically RCA and left circumflex Ischemic cardiomyopathy Hypertension Hyperlipidemia Chronic kidney disease stage III History of CVA Tobacco abuse Degenerative joint disease History of V. tach in the past Plan As per orders   [name], MD 06/18/2017, 6:26 PM

## 2017-06-18 NOTE — Progress Notes (Signed)
  ANTICOAGULATION CONSULT NOTE - Initial Consult  Pharmacy Consult for heparin  Indication: chest pain/ACS  Allergies  Allergen Reactions  . No Known Allergies     Patient Measurements: Height: 5\' 11"  (180.3 cm) Weight: 183 lb (83 kg) IBW/kg (Calculated) : 75.3 Heparin Dosing Weight: 83 kg   Vital Signs: Temp: 98.6 F (37 C) (06/25 1630) Temp Source: Oral (06/25 1630) BP: 142/69 (06/25 1930) Pulse Rate: 54 (06/25 1930)  Labs:  Recent Labs  06/18/17 1635  HGB 14.5  HCT 43.0  PLT 173  CREATININE 1.72*    CrCl cannot be calculated (Unknown ideal weight.).   Medical History: Past Medical History:  Diagnosis Date  . CAD (coronary artery disease)    stent in [place]  . Closed head injury    Fall 140' treated in [place]  . Heart failure ([place])   . Hypertension   . MI (myocardial infarction) ([place])   . Tobacco abuse    Assessment: 71 yo male admitted with chest pain. Pharmacy consulted to dose heparin. No oral anticoagulation PTA aside from ASA and plavix. CBC stable, no overt s/s bleeding noted.   Goal of Therapy:  Heparin level 0.3-0.7 units/ml Monitor platelets by anticoagulation protocol: Yes   Plan:  Heparin 4000 units x1, then start infusion at 1000 units/hr  Heparin level in 8 hours Heparin level and CBC daily Monitor for s/s bleeding  Argie Ramming, PharmD Pharmacy Resident  Pager [phone] 06/18/17 8:29 PM

## 2017-06-18 NOTE — ED Notes (Signed)
Dr. Rex Kras at bedside in triage.

## 2017-06-18 NOTE — ED Triage Notes (Signed)
Per Pt he is having chest pain that started about 3:30 pm. Pt was doing "nothing" when the pain started. Pt took 3 nitro prior to arrival, last one about 4 pm. The nitro helped initially but the pain increased afterwards. Pt has a hx of MI, about 6-8 months ago.  Pt states that the pain he is having feels the same as his heart attack. Pain is 4/10, describes the pain as pressure and sharp. States that the pain is in the center of his chest.

## 2017-06-18 NOTE — ED Notes (Signed)
Attempted to call report

## 2017-06-18 NOTE — Progress Notes (Signed)
Attempted to call back for report.

## 2017-06-19 DIAGNOSIS — I252 Old myocardial infarction: Secondary | ICD-10-CM | POA: Diagnosis not present

## 2017-06-19 DIAGNOSIS — N183 Chronic kidney disease, stage 3 (moderate): Secondary | ICD-10-CM | POA: Diagnosis not present

## 2017-06-19 DIAGNOSIS — I130 Hypertensive heart and chronic kidney disease with heart failure and stage 1 through stage 4 chronic kidney disease, or unspecified chronic kidney disease: Secondary | ICD-10-CM | POA: Diagnosis not present

## 2017-06-19 DIAGNOSIS — I25118 Atherosclerotic heart disease of native coronary artery with other forms of angina pectoris: Secondary | ICD-10-CM | POA: Diagnosis not present

## 2017-06-19 DIAGNOSIS — M1990 Unspecified osteoarthritis, unspecified site: Secondary | ICD-10-CM | POA: Diagnosis not present

## 2017-06-19 DIAGNOSIS — Z8673 Personal history of transient ischemic attack (TIA), and cerebral infarction without residual deficits: Secondary | ICD-10-CM | POA: Diagnosis not present

## 2017-06-19 DIAGNOSIS — I255 Ischemic cardiomyopathy: Secondary | ICD-10-CM | POA: Diagnosis not present

## 2017-06-19 DIAGNOSIS — I5022 Chronic systolic (congestive) heart failure: Secondary | ICD-10-CM | POA: Diagnosis not present

## 2017-06-19 DIAGNOSIS — E785 Hyperlipidemia, unspecified: Secondary | ICD-10-CM | POA: Diagnosis not present

## 2017-06-19 LAB — LIPID PANEL
Cholesterol: 111 mg/dL (ref 0–200)
HDL: 24 mg/dL — AB (ref >40)
LDL Cholesterol: 65 mg/dL (ref 0–99)
TRIGLYCERIDES: 108 mg/dL (ref <150)
Total CHOL/HDL Ratio: 4.6 RATIO
VLDL: 22 mg/dL (ref 0–40)

## 2017-06-19 LAB — BASIC METABOLIC PANEL
Anion gap: 9 (ref 5–15)
BUN: 30 mg/dL — ABNORMAL HIGH (ref 6–20)
CALCIUM: 9.2 mg/dL (ref 8.9–10.3)
CO2: 22 mmol/L (ref 22–32)
CREATININE: 1.7 mg/dL — AB (ref 0.61–1.24)
Chloride: 108 mmol/L (ref 101–111)
GFR calc non Af Amer: 39 mL/min — ABNORMAL LOW (ref >60)
GFR, EST AFRICAN AMERICAN: 45 mL/min — AB (ref >60)
Glucose, Bld: 101 mg/dL — ABNORMAL HIGH (ref 65–99)
Potassium: 4.6 mmol/L (ref 3.5–5.1)
SODIUM: 139 mmol/L (ref 135–145)

## 2017-06-19 LAB — TROPONIN I

## 2017-06-19 LAB — CBC
HCT: 39.9 % (ref 39.0–52.0)
Hemoglobin: 13.3 g/dL (ref 13.0–17.0)
MCH: 32.5 pg (ref 26.0–34.0)
MCHC: 33.3 g/dL (ref 30.0–36.0)
MCV: 97.6 fL (ref 78.0–100.0)
PLATELETS: 145 10*3/uL — AB (ref 150–400)
RBC: 4.09 MIL/uL — AB (ref 4.22–5.81)
RDW: 13.6 % (ref 11.5–15.5)
WBC: 6.4 10*3/uL (ref 4.0–10.5)

## 2017-06-19 LAB — HEPARIN LEVEL (UNFRACTIONATED): HEPARIN UNFRACTIONATED: 0.42 [IU]/mL (ref 0.30–0.70)

## 2017-06-19 MED ORDER — ISOSORBIDE MONONITRATE ER 60 MG PO TB24: 60.0000 mg | ORAL_TABLET | Freq: Every day | ORAL | 3 refills | Status: DC

## 2017-06-19 MED ORDER — NITROGLYCERIN 0.4 MG SL SUBL: 0.4000 mg | SUBLINGUAL_TABLET | SUBLINGUAL | 12 refills | Status: DC | PRN

## 2017-06-19 MED ORDER — SACUBITRIL-VALSARTAN 49-51 MG PO TABS: 1.0000 | ORAL_TABLET | Freq: Two times a day (BID) | ORAL | 3 refills | Status: DC

## 2017-06-19 NOTE — Discharge Instructions (Signed)

## 2017-06-19 NOTE — Progress Notes (Signed)
Late entry, patient discharge instructions reviewed with patient and wife, questions answered, Telemetry discontinued, IV site discontinue.  Discharged by wheelchair with wife at side.  Mervyn Skeeters, RN

## 2017-06-19 NOTE — Progress Notes (Signed)
ANTICOAGULATION CONSULT NOTE - Follow Up Consult  Pharmacy Consult for Heparin  Indication: chest pain/ACS  Allergies  Allergen Reactions  . No Known Allergies     Patient Measurements: Height: 5\' 11"  (180.3 cm) Weight: 183 lb (83 kg) IBW/kg (Calculated) : 75.3  Vital Signs: Temp: 98.1 F (36.7 C) (06/26 0451) Temp Source: Oral (06/26 0451) BP: 115/56 (06/26 0451) Pulse Rate: 53 (06/26 0451)  Labs:  Recent Labs  06/18/17 1635 06/18/17 2048 06/19/17 0135 06/19/17 0452  HGB 14.5  --   --  13.3  HCT 43.0  --   --  39.9  PLT 173  --   --  145*  HEPARINUNFRC  --   --   --  0.42  CREATININE 1.72*  --   --   --   TROPONINI  --  <0.03 <0.03  --     Estimated Creatinine Clearance: 42.6 mL/min (A) (by C-G formula based on SCr of 1.72 mg/dL (H)).   Assessment: Heparin for CP, cardiology following, initial heparin level is therapeutic  Goal of Therapy:  Heparin level 0.3-0.7 units/ml Monitor platelets by anticoagulation protocol: Yes   Plan:  -Cont heparin 1000 units/hr -1400 HL  [place], [place] 06/19/2017,6:12 AM

## 2017-06-19 NOTE — Discharge Summary (Signed)
NAME:  MERCY, MALENA NO.:  [id number]  MEDICAL RECORD NO.:  [id number]  LOCATION:                                 FACILITY:  PHYSICIAN:  Genesys Coggeshall N. Terrence Dupont, M.D.      DATE OF BIRTH:  DATE OF ADMISSION:  06/18/2017 DATE OF DISCHARGE:  06/19/2017                              DISCHARGE SUMMARY   ADMITTING DIAGNOSES: 1. Unstable angina, rule out myocardial infarction. 2. Multivessel coronary artery disease, history of non-Q-wave     myocardial infarction in the past, noted to have chronically     occluded right coronary artery and left circumflex. 3. Ischemic cardiomyopathy. 4. Hypertension. 5. Hyperlipidemia. 6. Chronic kidney disease stage 3. 7. History of cerebrovascular accident. 8. Tobacco abuse. 9. Degenerative joint disease. 10.History of ventricular tachycardia in the past.  FINAL DIAGNOSES: 1. Stable angina, myocardial infarction ruled out. 2. Multivessel coronary artery disease, history of non-Q-wave     myocardial infarction in the past, status post cardiac     catheterization in the past, noted to have chronically occluded     right coronary artery and left circumflex. 3. Ischemic cardiomyopathy. 4. Hypertension. 5. Hyperlipidemia. 6. Chronic kidney disease stage 3. 7. History of cerebrovascular accident. 8. Tobacco abuse. 9. Degenerative joint disease. 10.History of ventricular tachycardia in the past.  DISCHARGE HOME MEDICATIONS: 1. Nitrostat 0.4 mg sublingual p.r.n. as directed. 2. Entresto 49/51 mg one tablet twice daily. 3. Tylenol 650 mg every 8 hours as needed. 4. Amiodarone 200 mg daily. 5. Aspirin 81 mg daily. 6. Atorvastatin 80 mg daily. 7. Carvedilol 6.25 mg twice daily. 8. Clopidogrel 75 mg daily. 9. Lasix 40 mg daily. 10.Multivitamin with mineral 1 tablet daily. 11.Aldactone 25 mg half tablet daily. 12.Isosorbide mononitrate dose has been increased to 60 mg every day     and Entresto dose has been reduced as  above.  DIET:  Low-salt low-cholesterol diet.  ACTIVITY:  As tolerated.  DISCHARGE INSTRUCTIONS:  The patient has been advised regarding lifestyle changes, compliance with medication, diet and smoking cessation.  FOLLOWUP:  Follow up with me in 1 week.  CONDITION AT DISCHARGE:  Stable.  BRIEF HISTORY AND HOSPITAL COURSE:  Mr. Stanek is a 71 year old male with past medical history significant for multivessel coronary artery disease, status post non-Q-wave myocardial infarction in the past noted to have chronically occluded RCA and left circumflex, ischemic cardiomyopathy, EF approximately 30-35%, history of nonsustained VT in the past, history of congestive heart failure secondary to depressed LV systolic function, hypertension, hyperlipidemia, chronic kidney disease stage 3, tobacco abuse, history of CVA, he came to the ER complaining of retrosternal chest pain, described as pressure, grade 5/10 without associated nausea, vomiting, or diaphoresis.  The patient took 2 sublingual nitroglycerin 5 minutes apart initially with relief of chest pain, again had recurrent chest pain requiring 1 sublingual nitroglycerin with partial relief, so decided to come to the ED.  The patient denies any palpitation, lightheadedness, or syncope.  Denies PND, orthopnea, or leg swelling.  The patient received sublingual nitroglycerin and was placed on nitroglycerin paste in the ED with relief of chest pain.  First set of troponin I was negative.  EKG done in  the ED showed normal sinus rhythm with ST-T wave changes in inferolateral leads.  The patient recently had nuclear stress test, which showed inferolateral wall scarring with no evidence of ischemia with EF of 32%.  PHYSICAL EXAMINATION:  GENERAL:  He was alert, awake, oriented x3. VITAL SIGNS:  Blood pressure was 128/67, pulse 56, and he was afebrile. EYES:  Conjunctivae were pink. NECK:  Supple.  No JVD.  No bruit. LUNGS:  Clear to  auscultation without rhonchi, rales. CARDIOVASCULAR:  S1, S2 was normal.  There was soft systolic murmur.  No S3 or S4 gallop. ABDOMEN:  Soft, nontender. EXTREMITIES:  There is no clubbing, cyanosis, or edema.  LABORATORY DATA:  Three sets of troponin-I were negative.  Sodium was 138, potassium of 5, BUN 32, creatinine 1.72, glucose was 111, repeat fasting blood sugar was 101, cholesterol was 111, HDL was low 24, LDL 65, and triglycerides were 108.  Hemoglobin was 14.5, hematocrit 43, and white count of 7.5.  BRIEF HOSPITAL COURSE:  The patient was admitted to telemetry unit.  MI was ruled out by serial enzymes and EKG.  The patient did not have any episodes of further chest pain during the hospital stay.  The patient had Lexiscan Myoview last month, which showed inferolateral wall scarring with no evidence of ischemia with EF of 32%.  Reviewed old angiogram, which showed chronically occluded RCA, which is filling distally by collaterals from the left system.  Left circumflex is also chronically occluded.  Discussed with the patient various options of treatment regarding relook angiogram and possible staged PCI versus consideration for CABG.  The patient wanted to be treated medically.  At this point, the patient is eager to go home and will be discharged home on above medications and will be followed up in my office in 1 week.     Allegra Lai. Terrence Dupont, M.D.     M[facility]/MEDQ  D:  06/19/2017  T:  06/19/2017  Job:  [id number]

## 2017-06-19 NOTE — Discharge Summary (Signed)
Discharge summary dictated on the 26th 2018 dictation number is [phone]

## 2017-06-28 DIAGNOSIS — I1310 Hypertensive heart and chronic kidney disease without heart failure, with stage 1 through stage 4 chronic kidney disease, or unspecified chronic kidney disease: Secondary | ICD-10-CM | POA: Diagnosis not present

## 2017-06-28 DIAGNOSIS — I2510 Atherosclerotic heart disease of native coronary artery without angina pectoris: Secondary | ICD-10-CM | POA: Diagnosis not present

## 2017-06-28 DIAGNOSIS — Z720 Tobacco use: Secondary | ICD-10-CM | POA: Diagnosis not present

## 2017-06-28 DIAGNOSIS — F17290 Nicotine dependence, other tobacco product, uncomplicated: Secondary | ICD-10-CM | POA: Diagnosis not present

## 2017-06-28 DIAGNOSIS — Z716 Tobacco abuse counseling: Secondary | ICD-10-CM | POA: Diagnosis not present

## 2017-06-28 DIAGNOSIS — I255 Ischemic cardiomyopathy: Secondary | ICD-10-CM | POA: Diagnosis not present

## 2017-06-28 DIAGNOSIS — N189 Chronic kidney disease, unspecified: Secondary | ICD-10-CM | POA: Diagnosis not present

## 2017-06-28 DIAGNOSIS — I472 Ventricular tachycardia: Secondary | ICD-10-CM | POA: Diagnosis not present

## 2017-06-28 DIAGNOSIS — E785 Hyperlipidemia, unspecified: Secondary | ICD-10-CM | POA: Diagnosis not present

## 2017-07-04 ENCOUNTER — Encounter: Payer: Self-pay | Admitting: Vascular Surgery

## 2017-07-13 ENCOUNTER — Ambulatory Visit (INDEPENDENT_AMBULATORY_CARE_PROVIDER_SITE_OTHER): Payer: Medicare HMO | Admitting: Vascular Surgery

## 2017-07-13 ENCOUNTER — Encounter: Payer: Self-pay | Admitting: Vascular Surgery

## 2017-07-13 VITALS — BP 137/72 | HR 65 | Temp 98.2°F | Resp 18 | Ht 71.5 in | Wt 183.0 lb

## 2017-07-13 DIAGNOSIS — I714 Abdominal aortic aneurysm, without rupture, unspecified: Secondary | ICD-10-CM

## 2017-07-13 NOTE — Progress Notes (Signed)
Patient ID: Gregory Dyer, male   DOB: Oct 30, 1946, 71 y.o.   MRN: [id number]  Reason for Consult: New Evaluation (AAA)   Referred by Antony Contras, MD  Subjective:     HPI:  Gregory Dyer is a 71 y.o. male here today for evaluation of abdominal aortic aneurysm. He does have a history of a fall from a ladder and has significant weakness in his entire right side. He also has significant coronary artery disease having undergone coronary angiography in May of last year. He also has bilateral lower extremity hernia repairs. He does not have personal or family history of abdominal aortic aneurysm that he knows about. He is a chronic long-term smoker and continues to do so. He also has known CK D. Recently underwent ultrasound which demonstrated abdominal aortic aneurysm for which he now presents. He does have chronic back pain but no new back or abdominal pain.  Past Medical History:  Diagnosis Date  . AAA (abdominal aortic aneurysm) ([place])   . CAD (coronary artery disease)    stent in [place]  . Closed head injury    Fall 140' treated in [place]  . Heart failure ([place])   . Hypertension   . MI (myocardial infarction) ([place])   . Tobacco abuse    No family history on file. Past Surgical History:  Procedure Laterality Date  . CARDIAC CATHETERIZATION N/A 05/08/2016   Procedure: Left Heart Cath and Coronary Angiography;  Surgeon: Adrian Prows, MD;  Location: [facility];  Service: Cardiovascular;  Laterality: N/A;  . HERNIA REPAIR    . stent      Short Social History:  Social History  Substance Use Topics  . Smoking status: Current Every Day Smoker    Packs/day: 1.00    Years: 50.00    Types: Cigarettes  . Smokeless tobacco: Never Used  . Alcohol use 8.4 oz/week    14 Shots of liquor per week     Comment: drinks 2 glasses of Jim Beam and coke per day    Allergies  Allergen Reactions  . No Known Allergies     Current Outpatient Prescriptions  Medication Sig Dispense  Refill  . acetaminophen (TYLENOL) 650 MG CR tablet Take 650 mg by mouth every 8 (eight) hours as needed for pain.    Marland Kitchen amiodarone (PACERONE) 200 MG tablet Take 1 tablet (200 mg total) by mouth daily. 30 tablet 0  . aspirin 81 MG chewable tablet Chew 1 tablet (81 mg total) by mouth daily. 30 tablet 2  . atorvastatin (LIPITOR) 80 MG tablet Take 1 tablet (80 mg total) by mouth daily at 6 PM. 30 tablet 2  . carvedilol (COREG) 6.25 MG tablet Take 6.25 mg by mouth 2 (two) times daily.    . clopidogrel (PLAVIX) 75 MG tablet Take 1 tablet (75 mg total) by mouth daily with breakfast. 30 tablet 2  . furosemide (LASIX) 40 MG tablet Take 1 tablet (40 mg total) by mouth daily. 30 tablet 2  . isosorbide mononitrate (IMDUR) 60 MG 24 hr tablet Take 1 tablet (60 mg total) by mouth daily. 30 tablet 3  . Multiple Vitamin (MULTIVITAMIN WITH MINERALS) TABS tablet Take 1 tablet by mouth daily.    . nitroGLYCERIN (NITROSTAT) 0.4 MG SL tablet Place 1 tablet (0.4 mg total) under the tongue every 5 (five) minutes x 3 doses as needed for chest pain. 25 tablet 12  . sacubitril-valsartan (ENTRESTO) 49-51 MG Take 1 tablet by mouth 2 (two) times daily. [place]  tablet 3  . spironolactone (ALDACTONE) 25 MG tablet Take 0.5 tablets (12.5 mg total) by mouth daily. 30 tablet 0   No current facility-administered medications for this visit.     Review of Systems  Constitutional:  Constitutional negative. HENT: HENT negative.  Eyes: Eyes negative.  Respiratory: Positive for cough and wheezing.  Cardiovascular: Positive for claudication and dyspnea with exertion.  Skin: Positive for rash.  Neurological: Positive for dizziness, focal weakness and numbness.  Hematologic: Hematologic/lymphatic negative.  Psychiatric: Psychiatric negative.        Objective:  Objective   Vitals:   07/13/17 0950  BP: 137/72  Pulse: 65  Resp: 18  Temp: 98.2 F (36.8 C)  SpO2: 98%  Weight: 183 lb (83 kg)  Height: 5' 11.5" (1.816 m)   Body  mass index is 25.17 kg/m.  Physical Exam  Constitutional: He is oriented to person, place, and time. He appears well-developed.  HENT:  Head: Normocephalic.  Eyes: Pupils are equal, round, and reactive to light.  Neck: Normal range of motion.  Cardiovascular: Normal rate.   Pulses:      Femoral pulses are 2+ on the right side, and 2+ on the left side. No enlarged popliteal pulses  Abdominal: Soft. He exhibits no mass.  Musculoskeletal: Normal range of motion. He exhibits no edema.  Neurological: He is alert and oriented to person, place, and time.  Skin: Skin is warm and dry.  Psychiatric: His behavior is normal. Judgment and thought content normal.    Data: IMPRESSION: Maximal abdominal aortic diameter is 4.7 cm in the distal aorta. Recommend followup by abdomen and pelvis CTA in 6 months, and vascular surgery referral/consultation if not already obtained. This recommendation follows ACR consensus guidelines: White Paper of the ACR Incidental Findings Committee II on Vascular Findings. J Am Coll Radiol 2013; 10:789-794.      Assessment/Plan:     71 year old male presents for evaluation of his abdominal aortic aneurysm which she previously did not know he had. Currently measures up to 4.7 cm. He does have significant other problems including an ejection fraction of 30% on recent nuclear medicine scan and significant shortness of breath with activity. He would be high risk for any operation but definitely open repair of aneurysm. Given the current size we will have him follow up with CT scan in 6 months or we can look closer at the aneurysm and discuss his options for repair should meet size criteria at that time. We discussed the risks of rupture being the greatest after 5 or 6 cm and that we would consider repair when his aneurysm is not large. We also discussed the signs and symptoms of rupture for which he should seek medical attention. He demonstrated good understanding and we'll  see him in 6 month.       Waynetta Sandy MD [facility]

## 2017-07-27 NOTE — Addendum Note (Signed)
Addended by: Lianne Cure A on: 07/27/2017 09:29 AM   Modules accepted: Orders

## 2017-08-28 DIAGNOSIS — N183 Chronic kidney disease, stage 3 (moderate): Secondary | ICD-10-CM | POA: Diagnosis not present

## 2017-08-28 DIAGNOSIS — M50322 Other cervical disc degeneration at C5-C6 level: Secondary | ICD-10-CM | POA: Diagnosis not present

## 2017-08-28 DIAGNOSIS — I252 Old myocardial infarction: Secondary | ICD-10-CM | POA: Diagnosis not present

## 2017-08-28 DIAGNOSIS — N529 Male erectile dysfunction, unspecified: Secondary | ICD-10-CM | POA: Diagnosis not present

## 2017-08-28 DIAGNOSIS — I5022 Chronic systolic (congestive) heart failure: Secondary | ICD-10-CM | POA: Diagnosis not present

## 2017-08-28 DIAGNOSIS — Z8701 Personal history of pneumonia (recurrent): Secondary | ICD-10-CM | POA: Diagnosis not present

## 2017-08-28 DIAGNOSIS — E7800 Pure hypercholesterolemia, unspecified: Secondary | ICD-10-CM | POA: Diagnosis not present

## 2017-08-28 DIAGNOSIS — J309 Allergic rhinitis, unspecified: Secondary | ICD-10-CM | POA: Diagnosis not present

## 2017-08-28 DIAGNOSIS — M21961 Unspecified acquired deformity of right lower leg: Secondary | ICD-10-CM | POA: Diagnosis not present

## 2017-09-05 DIAGNOSIS — M542 Cervicalgia: Secondary | ICD-10-CM | POA: Diagnosis not present

## 2017-09-06 ENCOUNTER — Emergency Department (HOSPITAL_COMMUNITY): Payer: No Typology Code available for payment source

## 2017-09-06 ENCOUNTER — Encounter (HOSPITAL_COMMUNITY): Payer: Self-pay

## 2017-09-06 ENCOUNTER — Emergency Department (HOSPITAL_COMMUNITY)
Admission: EM | Admit: 2017-09-06 | Discharge: 2017-09-06 | Disposition: A | Discharge status: Home / Self Care | Payer: No Typology Code available for payment source | Attending: Emergency Medicine | Admitting: Emergency Medicine

## 2017-09-06 ENCOUNTER — Other Ambulatory Visit: Payer: Self-pay | Admitting: Orthopaedic Surgery

## 2017-09-06 DIAGNOSIS — I509 Heart failure, unspecified: Secondary | ICD-10-CM | POA: Insufficient documentation

## 2017-09-06 DIAGNOSIS — Y998 Other external cause status: Secondary | ICD-10-CM | POA: Diagnosis not present

## 2017-09-06 DIAGNOSIS — I252 Old myocardial infarction: Secondary | ICD-10-CM | POA: Insufficient documentation

## 2017-09-06 DIAGNOSIS — S0990XA Unspecified injury of head, initial encounter: Secondary | ICD-10-CM | POA: Diagnosis not present

## 2017-09-06 DIAGNOSIS — S299XXA Unspecified injury of thorax, initial encounter: Secondary | ICD-10-CM | POA: Diagnosis not present

## 2017-09-06 DIAGNOSIS — S3993XA Unspecified injury of pelvis, initial encounter: Secondary | ICD-10-CM | POA: Diagnosis not present

## 2017-09-06 DIAGNOSIS — I110 Hypertensive heart disease with heart failure: Secondary | ICD-10-CM | POA: Diagnosis not present

## 2017-09-06 DIAGNOSIS — M545 Low back pain: Secondary | ICD-10-CM | POA: Insufficient documentation

## 2017-09-06 DIAGNOSIS — F17210 Nicotine dependence, cigarettes, uncomplicated: Secondary | ICD-10-CM | POA: Diagnosis not present

## 2017-09-06 DIAGNOSIS — M542 Cervicalgia: Secondary | ICD-10-CM | POA: Diagnosis not present

## 2017-09-06 DIAGNOSIS — Y939 Activity, unspecified: Secondary | ICD-10-CM | POA: Insufficient documentation

## 2017-09-06 DIAGNOSIS — Y92410 Unspecified street and highway as the place of occurrence of the external cause: Secondary | ICD-10-CM | POA: Insufficient documentation

## 2017-09-06 DIAGNOSIS — I2510 Atherosclerotic heart disease of native coronary artery without angina pectoris: Secondary | ICD-10-CM | POA: Insufficient documentation

## 2017-09-06 DIAGNOSIS — T148XXA Other injury of unspecified body region, initial encounter: Secondary | ICD-10-CM | POA: Diagnosis not present

## 2017-09-06 DIAGNOSIS — G959 Disease of spinal cord, unspecified: Secondary | ICD-10-CM

## 2017-09-06 DIAGNOSIS — S199XXA Unspecified injury of neck, initial encounter: Secondary | ICD-10-CM | POA: Diagnosis not present

## 2017-09-06 DIAGNOSIS — R51 Headache: Secondary | ICD-10-CM | POA: Diagnosis not present

## 2017-09-06 MED ORDER — HYDROCODONE-ACETAMINOPHEN 5-325 MG PO TABS: 1.0000 | ORAL_TABLET | Freq: Four times a day (QID) | ORAL | 0 refills | Status: DC | PRN

## 2017-09-06 MED ORDER — HYDROCODONE-ACETAMINOPHEN 5-325 MG PO TABS
1.0000 | ORAL_TABLET | Freq: Once | ORAL | Status: AC
  Administered 2017-09-06: 1 via ORAL
  Filled 2017-09-06: qty 1

## 2017-09-06 MED ORDER — KETOROLAC TROMETHAMINE 60 MG/2ML IM SOLN
30.0000 mg | Freq: Once | INTRAMUSCULAR | Status: AC
  Administered 2017-09-06: 30 mg via INTRAMUSCULAR
  Filled 2017-09-06: qty 2
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
## 2017-09-06 NOTE — ED Notes (Signed)
Pt taken to xray 

## 2017-09-18 ENCOUNTER — Encounter: Payer: Self-pay | Admitting: Neurology

## 2017-09-18 ENCOUNTER — Ambulatory Visit (INDEPENDENT_AMBULATORY_CARE_PROVIDER_SITE_OTHER): Payer: Medicare HMO | Admitting: Neurology

## 2017-09-18 DIAGNOSIS — M545 Low back pain, unspecified: Secondary | ICD-10-CM

## 2017-09-18 DIAGNOSIS — Z87820 Personal history of traumatic brain injury: Secondary | ICD-10-CM | POA: Insufficient documentation

## 2017-09-18 DIAGNOSIS — G8929 Other chronic pain: Secondary | ICD-10-CM | POA: Diagnosis not present

## 2017-09-18 DIAGNOSIS — R269 Unspecified abnormalities of gait and mobility: Secondary | ICD-10-CM | POA: Insufficient documentation

## 2017-09-18 DIAGNOSIS — R52 Pain, unspecified: Secondary | ICD-10-CM | POA: Diagnosis not present

## 2017-09-18 DIAGNOSIS — E538 Deficiency of other specified B group vitamins: Secondary | ICD-10-CM | POA: Diagnosis not present

## 2017-09-18 DIAGNOSIS — M542 Cervicalgia: Secondary | ICD-10-CM | POA: Diagnosis not present

## 2017-09-18 HISTORY — DX: Personal history of traumatic brain injury: Z87.820

## 2017-09-18 HISTORY — DX: Low back pain, unspecified: M54.50

## 2017-09-18 HISTORY — DX: Unspecified abnormalities of gait and mobility: R26.9

## 2017-09-18 MED ORDER — LAMOTRIGINE 25 MG PO TABS: ORAL_TABLET | ORAL | 0 refills | Status: DC

## 2017-09-18 MED ORDER — LAMOTRIGINE 100 MG PO TABS: 100.0000 mg | ORAL_TABLET | Freq: Two times a day (BID) | ORAL | 11 refills | Status: DC
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
## 2017-09-19 LAB — VITAMIN B12: Vitamin B-12: 409 pg/mL (ref 232–1245)

## 2017-09-19 LAB — TSH: TSH: 1.52 u[IU]/mL (ref 0.450–4.500)

## 2017-09-20 ENCOUNTER — Telehealth: Payer: Self-pay | Admitting: Neurology
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
## 2017-09-27 DIAGNOSIS — I1310 Hypertensive heart and chronic kidney disease without heart failure, with stage 1 through stage 4 chronic kidney disease, or unspecified chronic kidney disease: Secondary | ICD-10-CM | POA: Diagnosis not present

## 2017-09-27 DIAGNOSIS — I2510 Atherosclerotic heart disease of native coronary artery without angina pectoris: Secondary | ICD-10-CM | POA: Diagnosis not present

## 2017-09-27 DIAGNOSIS — Z720 Tobacco use: Secondary | ICD-10-CM | POA: Diagnosis not present

## 2017-09-27 DIAGNOSIS — E785 Hyperlipidemia, unspecified: Secondary | ICD-10-CM | POA: Diagnosis not present

## 2017-09-27 DIAGNOSIS — I639 Cerebral infarction, unspecified: Secondary | ICD-10-CM | POA: Diagnosis not present

## 2017-09-27 DIAGNOSIS — Z716 Tobacco abuse counseling: Secondary | ICD-10-CM | POA: Diagnosis not present

## 2017-09-27 DIAGNOSIS — I5020 Unspecified systolic (congestive) heart failure: Secondary | ICD-10-CM | POA: Diagnosis not present

## 2017-09-27 DIAGNOSIS — I255 Ischemic cardiomyopathy: Secondary | ICD-10-CM | POA: Diagnosis not present

## 2017-09-27 DIAGNOSIS — N189 Chronic kidney disease, unspecified: Secondary | ICD-10-CM | POA: Diagnosis not present

## 2017-10-01 ENCOUNTER — Ambulatory Visit (INDEPENDENT_AMBULATORY_CARE_PROVIDER_SITE_OTHER): Payer: Medicare HMO | Admitting: Neurology

## 2017-10-01 DIAGNOSIS — R269 Unspecified abnormalities of gait and mobility: Secondary | ICD-10-CM

## 2017-10-01 DIAGNOSIS — M545 Low back pain, unspecified: Secondary | ICD-10-CM

## 2017-10-01 DIAGNOSIS — R2990 Unspecified symptoms and signs involving the nervous system: Secondary | ICD-10-CM

## 2017-10-01 DIAGNOSIS — M542 Cervicalgia: Secondary | ICD-10-CM

## 2017-10-01 DIAGNOSIS — Z87820 Personal history of traumatic brain injury: Secondary | ICD-10-CM

## 2017-10-01 DIAGNOSIS — G8929 Other chronic pain: Secondary | ICD-10-CM

## 2017-10-09 ENCOUNTER — Telehealth: Payer: Self-pay | Admitting: Neurology
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
## 2017-10-09 NOTE — Procedures (Signed)
   HISTORY: 71 years old male, with history of traumatic brain injury, CT of the head showed evidence of left temporal, occipital parietal junction encephalomalacia, complains of worsening memory loss, intermittent right side difficulty  TECHNIQUE:  16 channel EEG was performed based on standard 10-16 international system. One channel was dedicated to EKG, which has demonstrates normal rhythm   Upon awakening, the background activity was mildly dysrhythmic, there was apparent asymmetry, there is evidence of higher amplitude, slower breach rhythm involving left hemisphere, mainly at left temporoparietal region. There is evidence of frequent T5 sharp transient. There was no clinical seizure activity noticed.  Posterior background activity was mildly dysrhythmic, In the theta range,   Photic stimulation was performed, there is continued evidence of asymmetry, with frequent T5 sharp transient,   CONCLUSION: This is  an abnormal EEG. There is evidence of breach rhythm involving left hemisphere, left temporoparietal region, frequent T5 sharp transient, but there is no evidence of clinical seizure, above findings consistent with his previous traumatic brain injury, and left hemisphere encephalomalacia on the CAT scan.  He is at increased risk for developing partial seizure.     Marcial Pacas, M.D. Ph.D.  [facility] [place], [place] [address] Phone: [phone] Fax:      [phone]

## 2017-10-09 NOTE — Telephone Encounter (Signed)
Pt had his EEG on 10-8, he is asking for a call as soon as the results are available

## 2017-10-10 NOTE — Telephone Encounter (Signed)
Spoke to his wife on HIPAA - she is aware of results and verbalized understanding to continue his lamotrigine.  States he is doing well on lamotrigine.  No adverse side effects and his pain has improved since starting the medication.

## 2017-10-10 NOTE — Telephone Encounter (Signed)
Marcial Pacas, MD 15 hours ago (4:41 PM)      Please call patient, EEG showed evidence of left side slowing and irritability, which is consistent with previous head trauma, this made him prone to develop seizure.    Let him keep on Lamotrigine, also check to see his response to Lamotrigine.     This is  an abnormal EEG. There is evidence of breach rhythm involving left hemisphere, left temporoparietal region, frequent T5 sharp transient, but there is no evidence of clinical seizure, above findings consistent with his previous traumatic brain injury, and left hemisphere encephalomalacia on the CAT scan.  He is at increased risk for developing partial seizure.

## 2017-10-31 ENCOUNTER — Telehealth: Payer: Self-pay | Admitting: Neurology

## 2017-10-31 NOTE — Telephone Encounter (Signed)
Pt called to c/a appt on 12/17. When asked a reason to c/a the appt he said I will be out of town and will call back to r/s. I told him I hope he has a good holiday. He said oh I will, I will be out of town forever.   FYI

## 2017-10-31 NOTE — Telephone Encounter (Signed)
Please call patient or his wife,  CT head showed evidence of left temporal lobe encephalomalacia, consistent with previous history of trauma/stroke, EEG showed evidence of breach rhythm involving left temporal region, sharp transient at left temporal region,  He is at increased risk for seizure, he needs to follow-up with a neurologist, either our clinic or other neurologist.

## 2017-11-01 ENCOUNTER — Telehealth: Payer: Self-pay | Admitting: *Deleted

## 2017-11-01 NOTE — Telephone Encounter (Signed)
Pt medical records @ front desk for p/u

## 2017-11-01 NOTE — Telephone Encounter (Signed)
I spoke to pts wife.  He is taking now only lamictal 100mg  po in am since having issues with sleep since started taking this medication.  I relayed that he is taking for prevention of sz I would relay to Dr. Krista Blue.  They cancelled appt as pt and wife are moving to [place], [place] on the 11-10-18.  They had appt for pcp, in new town, wo get neurology referral.   I relayed that due to his brain trauma (CT and EEG results) that is the reason for the lamictal and for prevention of seizures.  Will relay to Dr. Krista Blue re: sleep issues.

## 2017-11-01 NOTE — Telephone Encounter (Addendum)
It is ok for him to continue follow up with neurologist in [facility],   [facility] as seizure prevention, he should take 100mg  bid.  He may take time to titrating up dosage

## 2017-11-02 NOTE — Telephone Encounter (Signed)
Spoke to pts wife and relayed that he should be on lamotrigine 100mg  po BID and is taking once in morning for a month now since causing issues with sleep.  Since he has been doing well, would try increasing to BID again and see how he does.  She stated she will do that and f/u with pcp in [facility].

## 2017-11-26 ENCOUNTER — Other Ambulatory Visit: Payer: Self-pay | Admitting: *Deleted

## 2017-11-26 MED ORDER — LAMOTRIGINE 100 MG PO TABS: 100.0000 mg | ORAL_TABLET | Freq: Two times a day (BID) | ORAL | 0 refills | Status: DC

## 2017-12-10 ENCOUNTER — Ambulatory Visit: Payer: Medicare HMO | Admitting: Neurology

## 2017-12-27 ENCOUNTER — Telehealth: Payer: Self-pay | Admitting: Vascular Surgery

## 2017-12-27 NOTE — Telephone Encounter (Signed)
Attempted to reach patient as a reminder of his upcoming CTA appt but the phone # we have in Epic is not a valid phone number.  The patient is scheduled for 01/25/18 at 10:30am for a CT at [address] [facility]. He is to arrive at 10am w/ no solid foods 4 hours prior. He is also scheduled to see Dr.Cain at 1pm at our office on that same date. I mailed the information to the patient as well. awt

## 2018-01-18 ENCOUNTER — Ambulatory Visit: Payer: Medicare HMO | Admitting: Vascular Surgery

## 2018-01-25 ENCOUNTER — Ambulatory Visit: Payer: Medicare HMO | Admitting: Vascular Surgery

## 2018-01-25 ENCOUNTER — Other Ambulatory Visit: Payer: Medicare HMO

## 2018-08-12 DIAGNOSIS — I2582 Chronic total occlusion of coronary artery: Secondary | ICD-10-CM | POA: Diagnosis present

## 2018-08-12 DIAGNOSIS — D696 Thrombocytopenia, unspecified: Secondary | ICD-10-CM | POA: Diagnosis not present

## 2018-08-12 DIAGNOSIS — I25768 Atherosclerosis of bypass graft of coronary artery of transplanted heart with other forms of angina pectoris: Secondary | ICD-10-CM | POA: Diagnosis not present

## 2018-08-12 DIAGNOSIS — Z87820 Personal history of traumatic brain injury: Secondary | ICD-10-CM | POA: Diagnosis not present

## 2018-08-12 DIAGNOSIS — I340 Nonrheumatic mitral (valve) insufficiency: Secondary | ICD-10-CM | POA: Diagnosis present

## 2018-08-12 DIAGNOSIS — F05 Delirium due to known physiological condition: Secondary | ICD-10-CM | POA: Diagnosis not present

## 2018-08-12 DIAGNOSIS — I714 Abdominal aortic aneurysm, without rupture: Secondary | ICD-10-CM | POA: Diagnosis present

## 2018-08-12 DIAGNOSIS — K7030 Alcoholic cirrhosis of liver without ascites: Secondary | ICD-10-CM | POA: Diagnosis present

## 2018-08-12 DIAGNOSIS — Z48812 Encounter for surgical aftercare following surgery on the circulatory system: Secondary | ICD-10-CM | POA: Diagnosis not present

## 2018-08-12 DIAGNOSIS — I5023 Acute on chronic systolic (congestive) heart failure: Secondary | ICD-10-CM | POA: Diagnosis not present

## 2018-08-12 DIAGNOSIS — M6281 Muscle weakness (generalized): Secondary | ICD-10-CM | POA: Diagnosis not present

## 2018-08-12 DIAGNOSIS — I10 Essential (primary) hypertension: Secondary | ICD-10-CM | POA: Diagnosis not present

## 2018-08-12 DIAGNOSIS — I132 Hypertensive heart and chronic kidney disease with heart failure and with stage 5 chronic kidney disease, or end stage renal disease: Secondary | ICD-10-CM | POA: Diagnosis not present

## 2018-08-12 DIAGNOSIS — I2510 Atherosclerotic heart disease of native coronary artery without angina pectoris: Secondary | ICD-10-CM | POA: Diagnosis present

## 2018-08-12 DIAGNOSIS — D62 Acute posthemorrhagic anemia: Secondary | ICD-10-CM | POA: Diagnosis not present

## 2018-08-12 DIAGNOSIS — F0150 Vascular dementia without behavioral disturbance: Secondary | ICD-10-CM | POA: Diagnosis present

## 2018-08-12 DIAGNOSIS — R262 Difficulty in walking, not elsewhere classified: Secondary | ICD-10-CM | POA: Diagnosis not present

## 2018-08-12 DIAGNOSIS — N183 Chronic kidney disease, stage 3 (moderate): Secondary | ICD-10-CM | POA: Diagnosis present

## 2018-08-12 DIAGNOSIS — I255 Ischemic cardiomyopathy: Secondary | ICD-10-CM | POA: Diagnosis present

## 2018-08-12 DIAGNOSIS — I509 Heart failure, unspecified: Secondary | ICD-10-CM | POA: Diagnosis not present

## 2018-08-12 DIAGNOSIS — R279 Unspecified lack of coordination: Secondary | ICD-10-CM | POA: Diagnosis not present

## 2018-08-12 DIAGNOSIS — I480 Paroxysmal atrial fibrillation: Secondary | ICD-10-CM | POA: Diagnosis not present

## 2018-08-12 DIAGNOSIS — I252 Old myocardial infarction: Secondary | ICD-10-CM | POA: Diagnosis not present

## 2018-08-12 DIAGNOSIS — I4892 Unspecified atrial flutter: Secondary | ICD-10-CM | POA: Diagnosis not present

## 2018-08-12 DIAGNOSIS — E785 Hyperlipidemia, unspecified: Secondary | ICD-10-CM | POA: Diagnosis present

## 2018-08-12 DIAGNOSIS — I513 Intracardiac thrombosis, not elsewhere classified: Secondary | ICD-10-CM | POA: Diagnosis present

## 2018-08-12 DIAGNOSIS — F17210 Nicotine dependence, cigarettes, uncomplicated: Secondary | ICD-10-CM | POA: Diagnosis present

## 2018-08-12 DIAGNOSIS — I472 Ventricular tachycardia: Secondary | ICD-10-CM | POA: Diagnosis present

## 2018-08-12 DIAGNOSIS — I4891 Unspecified atrial fibrillation: Secondary | ICD-10-CM | POA: Diagnosis not present

## 2018-08-12 DIAGNOSIS — N179 Acute kidney failure, unspecified: Secondary | ICD-10-CM | POA: Diagnosis present

## 2018-08-12 DIAGNOSIS — R296 Repeated falls: Secondary | ICD-10-CM | POA: Diagnosis present

## 2018-08-12 DIAGNOSIS — F0631 Mood disorder due to known physiological condition with depressive features: Secondary | ICD-10-CM | POA: Diagnosis not present

## 2018-08-12 DIAGNOSIS — I69351 Hemiplegia and hemiparesis following cerebral infarction affecting right dominant side: Secondary | ICD-10-CM | POA: Diagnosis not present

## 2018-08-12 DIAGNOSIS — N390 Urinary tract infection, site not specified: Secondary | ICD-10-CM | POA: Diagnosis not present

## 2018-08-12 DIAGNOSIS — J449 Chronic obstructive pulmonary disease, unspecified: Secondary | ICD-10-CM | POA: Diagnosis present

## 2018-09-01 DIAGNOSIS — R279 Unspecified lack of coordination: Secondary | ICD-10-CM | POA: Diagnosis not present

## 2018-09-01 DIAGNOSIS — M6281 Muscle weakness (generalized): Secondary | ICD-10-CM | POA: Diagnosis not present

## 2018-09-01 DIAGNOSIS — E785 Hyperlipidemia, unspecified: Secondary | ICD-10-CM | POA: Diagnosis not present

## 2018-09-01 DIAGNOSIS — I2510 Atherosclerotic heart disease of native coronary artery without angina pectoris: Secondary | ICD-10-CM | POA: Diagnosis not present

## 2018-09-01 DIAGNOSIS — I509 Heart failure, unspecified: Secondary | ICD-10-CM | POA: Diagnosis not present

## 2018-09-01 DIAGNOSIS — J449 Chronic obstructive pulmonary disease, unspecified: Secondary | ICD-10-CM | POA: Diagnosis not present

## 2018-09-01 DIAGNOSIS — I480 Paroxysmal atrial fibrillation: Secondary | ICD-10-CM | POA: Diagnosis not present

## 2018-09-01 DIAGNOSIS — K7030 Alcoholic cirrhosis of liver without ascites: Secondary | ICD-10-CM | POA: Diagnosis not present

## 2018-09-01 DIAGNOSIS — Z87820 Personal history of traumatic brain injury: Secondary | ICD-10-CM | POA: Diagnosis not present

## 2018-09-01 DIAGNOSIS — I25768 Atherosclerosis of bypass graft of coronary artery of transplanted heart with other forms of angina pectoris: Secondary | ICD-10-CM | POA: Diagnosis not present

## 2018-09-01 DIAGNOSIS — I714 Abdominal aortic aneurysm, without rupture: Secondary | ICD-10-CM | POA: Diagnosis not present

## 2018-09-01 DIAGNOSIS — I10 Essential (primary) hypertension: Secondary | ICD-10-CM | POA: Diagnosis not present

## 2018-09-01 DIAGNOSIS — R262 Difficulty in walking, not elsewhere classified: Secondary | ICD-10-CM | POA: Diagnosis not present

## 2018-09-01 DIAGNOSIS — Z48812 Encounter for surgical aftercare following surgery on the circulatory system: Secondary | ICD-10-CM | POA: Diagnosis not present

## 2018-09-01 DIAGNOSIS — F0631 Mood disorder due to known physiological condition with depressive features: Secondary | ICD-10-CM | POA: Diagnosis not present

## 2020-10-26 DIAGNOSIS — Z7901 Long term (current) use of anticoagulants: Secondary | ICD-10-CM | POA: Insufficient documentation

## 2020-10-26 DIAGNOSIS — Z95828 Presence of other vascular implants and grafts: Secondary | ICD-10-CM

## 2020-10-26 DIAGNOSIS — Z951 Presence of aortocoronary bypass graft: Secondary | ICD-10-CM | POA: Insufficient documentation

## 2020-10-26 DIAGNOSIS — I2510 Atherosclerotic heart disease of native coronary artery without angina pectoris: Secondary | ICD-10-CM

## 2020-10-26 DIAGNOSIS — I5022 Chronic systolic (congestive) heart failure: Secondary | ICD-10-CM

## 2020-10-26 DIAGNOSIS — I739 Peripheral vascular disease, unspecified: Secondary | ICD-10-CM

## 2020-10-26 HISTORY — DX: Peripheral vascular disease, unspecified: I73.9

## 2020-10-26 HISTORY — DX: Presence of aortocoronary bypass graft: Z95.1

## 2020-10-26 HISTORY — DX: Presence of other vascular implants and grafts: Z95.828

## 2020-10-26 HISTORY — DX: Atherosclerotic heart disease of native coronary artery without angina pectoris: I25.10

## 2020-10-26 HISTORY — DX: Chronic systolic (congestive) heart failure: I50.22

## 2020-12-14 DIAGNOSIS — K922 Gastrointestinal hemorrhage, unspecified: Secondary | ICD-10-CM

## 2020-12-14 DIAGNOSIS — K7030 Alcoholic cirrhosis of liver without ascites: Secondary | ICD-10-CM

## 2020-12-14 HISTORY — DX: Gastrointestinal hemorrhage, unspecified: K92.2

## 2020-12-14 HISTORY — DX: Alcoholic cirrhosis of liver without ascites: K70.30

## 2020-12-15 DIAGNOSIS — K921 Melena: Secondary | ICD-10-CM

## 2020-12-15 HISTORY — DX: Melena: K92.1

## 2021-03-05 DIAGNOSIS — I480 Paroxysmal atrial fibrillation: Secondary | ICD-10-CM | POA: Diagnosis not present

## 2021-03-06 DIAGNOSIS — I340 Nonrheumatic mitral (valve) insufficiency: Secondary | ICD-10-CM

## 2021-03-06 DIAGNOSIS — I361 Nonrheumatic tricuspid (valve) insufficiency: Secondary | ICD-10-CM

## 2021-03-07 DIAGNOSIS — Z951 Presence of aortocoronary bypass graft: Secondary | ICD-10-CM | POA: Diagnosis not present

## 2021-03-07 DIAGNOSIS — I2510 Atherosclerotic heart disease of native coronary artery without angina pectoris: Secondary | ICD-10-CM | POA: Diagnosis not present

## 2021-03-07 DIAGNOSIS — I480 Paroxysmal atrial fibrillation: Secondary | ICD-10-CM

## 2021-03-07 DIAGNOSIS — I714 Abdominal aortic aneurysm, without rupture: Secondary | ICD-10-CM | POA: Diagnosis not present

## 2021-03-07 DIAGNOSIS — I429 Cardiomyopathy, unspecified: Secondary | ICD-10-CM | POA: Diagnosis not present

## 2021-03-07 DIAGNOSIS — Z01810 Encounter for preprocedural cardiovascular examination: Secondary | ICD-10-CM

## 2021-03-08 DIAGNOSIS — I429 Cardiomyopathy, unspecified: Secondary | ICD-10-CM

## 2021-03-08 DIAGNOSIS — I714 Abdominal aortic aneurysm, without rupture: Secondary | ICD-10-CM | POA: Diagnosis not present

## 2021-03-08 DIAGNOSIS — I480 Paroxysmal atrial fibrillation: Secondary | ICD-10-CM

## 2021-03-08 DIAGNOSIS — I2510 Atherosclerotic heart disease of native coronary artery without angina pectoris: Secondary | ICD-10-CM

## 2021-03-08 DIAGNOSIS — Z01810 Encounter for preprocedural cardiovascular examination: Secondary | ICD-10-CM

## 2021-03-08 DIAGNOSIS — Z951 Presence of aortocoronary bypass graft: Secondary | ICD-10-CM

## 2021-03-09 DIAGNOSIS — Z7901 Long term (current) use of anticoagulants: Secondary | ICD-10-CM

## 2021-03-09 DIAGNOSIS — I480 Paroxysmal atrial fibrillation: Secondary | ICD-10-CM

## 2021-03-09 DIAGNOSIS — I429 Cardiomyopathy, unspecified: Secondary | ICD-10-CM

## 2021-03-09 DIAGNOSIS — I2510 Atherosclerotic heart disease of native coronary artery without angina pectoris: Secondary | ICD-10-CM | POA: Diagnosis not present

## 2021-03-09 DIAGNOSIS — Z720 Tobacco use: Secondary | ICD-10-CM

## 2021-03-10 DIAGNOSIS — I2510 Atherosclerotic heart disease of native coronary artery without angina pectoris: Secondary | ICD-10-CM | POA: Diagnosis not present

## 2021-03-10 DIAGNOSIS — I429 Cardiomyopathy, unspecified: Secondary | ICD-10-CM | POA: Diagnosis not present

## 2021-03-10 DIAGNOSIS — Z951 Presence of aortocoronary bypass graft: Secondary | ICD-10-CM | POA: Diagnosis not present

## 2021-03-10 DIAGNOSIS — I714 Abdominal aortic aneurysm, without rupture: Secondary | ICD-10-CM | POA: Diagnosis not present

## 2021-10-24 DIAGNOSIS — Z7689 Persons encountering health services in other specified circumstances: Secondary | ICD-10-CM | POA: Diagnosis not present

## 2021-10-24 DIAGNOSIS — I2510 Atherosclerotic heart disease of native coronary artery without angina pectoris: Secondary | ICD-10-CM | POA: Diagnosis not present

## 2021-10-24 DIAGNOSIS — Z7901 Long term (current) use of anticoagulants: Secondary | ICD-10-CM | POA: Diagnosis not present

## 2021-10-24 DIAGNOSIS — Z79899 Other long term (current) drug therapy: Secondary | ICD-10-CM | POA: Diagnosis not present

## 2021-10-24 DIAGNOSIS — E538 Deficiency of other specified B group vitamins: Secondary | ICD-10-CM | POA: Diagnosis not present

## 2021-10-24 DIAGNOSIS — E559 Vitamin D deficiency, unspecified: Secondary | ICD-10-CM | POA: Diagnosis not present

## 2021-10-24 DIAGNOSIS — I5022 Chronic systolic (congestive) heart failure: Secondary | ICD-10-CM | POA: Diagnosis not present

## 2021-10-24 DIAGNOSIS — M159 Polyosteoarthritis, unspecified: Secondary | ICD-10-CM | POA: Diagnosis not present

## 2021-10-24 DIAGNOSIS — I480 Paroxysmal atrial fibrillation: Secondary | ICD-10-CM | POA: Diagnosis not present

## 2021-10-24 DIAGNOSIS — N189 Chronic kidney disease, unspecified: Secondary | ICD-10-CM | POA: Diagnosis not present

## 2021-11-01 ENCOUNTER — Other Ambulatory Visit: Payer: Self-pay

## 2021-11-01 DIAGNOSIS — S0990XA Unspecified injury of head, initial encounter: Secondary | ICD-10-CM | POA: Insufficient documentation

## 2021-11-01 DIAGNOSIS — I509 Heart failure, unspecified: Secondary | ICD-10-CM | POA: Insufficient documentation

## 2021-11-01 DIAGNOSIS — N189 Chronic kidney disease, unspecified: Secondary | ICD-10-CM | POA: Insufficient documentation

## 2021-11-01 DIAGNOSIS — I219 Acute myocardial infarction, unspecified: Secondary | ICD-10-CM | POA: Insufficient documentation

## 2021-11-01 DIAGNOSIS — I480 Paroxysmal atrial fibrillation: Secondary | ICD-10-CM | POA: Insufficient documentation

## 2021-11-01 DIAGNOSIS — M7989 Other specified soft tissue disorders: Secondary | ICD-10-CM | POA: Diagnosis not present

## 2021-11-01 DIAGNOSIS — R600 Localized edema: Secondary | ICD-10-CM | POA: Diagnosis not present

## 2021-11-01 DIAGNOSIS — E538 Deficiency of other specified B group vitamins: Secondary | ICD-10-CM | POA: Insufficient documentation

## 2021-11-01 DIAGNOSIS — M1990 Unspecified osteoarthritis, unspecified site: Secondary | ICD-10-CM | POA: Insufficient documentation

## 2021-11-01 DIAGNOSIS — I714 Abdominal aortic aneurysm, without rupture, unspecified: Secondary | ICD-10-CM | POA: Insufficient documentation

## 2021-11-01 DIAGNOSIS — E7800 Pure hypercholesterolemia, unspecified: Secondary | ICD-10-CM | POA: Insufficient documentation

## 2021-11-01 DIAGNOSIS — G4700 Insomnia, unspecified: Secondary | ICD-10-CM | POA: Insufficient documentation

## 2021-11-01 DIAGNOSIS — M79604 Pain in right leg: Secondary | ICD-10-CM | POA: Diagnosis not present

## 2021-11-01 DIAGNOSIS — I10 Essential (primary) hypertension: Secondary | ICD-10-CM | POA: Insufficient documentation

## 2021-11-07 DIAGNOSIS — D649 Anemia, unspecified: Secondary | ICD-10-CM | POA: Diagnosis not present

## 2021-11-07 DIAGNOSIS — E559 Vitamin D deficiency, unspecified: Secondary | ICD-10-CM | POA: Diagnosis not present

## 2021-11-07 DIAGNOSIS — I5022 Chronic systolic (congestive) heart failure: Secondary | ICD-10-CM | POA: Diagnosis not present

## 2021-11-07 DIAGNOSIS — I2510 Atherosclerotic heart disease of native coronary artery without angina pectoris: Secondary | ICD-10-CM | POA: Diagnosis not present

## 2021-11-07 DIAGNOSIS — Z7901 Long term (current) use of anticoagulants: Secondary | ICD-10-CM | POA: Diagnosis not present

## 2021-11-07 DIAGNOSIS — Z0000 Encounter for general adult medical examination without abnormal findings: Secondary | ICD-10-CM | POA: Diagnosis not present

## 2021-11-07 DIAGNOSIS — I480 Paroxysmal atrial fibrillation: Secondary | ICD-10-CM | POA: Diagnosis not present

## 2021-11-07 DIAGNOSIS — R5381 Other malaise: Secondary | ICD-10-CM | POA: Diagnosis not present

## 2021-11-07 DIAGNOSIS — M24574 Contracture, right foot: Secondary | ICD-10-CM | POA: Diagnosis not present

## 2021-11-28 DIAGNOSIS — M79674 Pain in right toe(s): Secondary | ICD-10-CM | POA: Diagnosis not present

## 2021-11-28 DIAGNOSIS — B351 Tinea unguium: Secondary | ICD-10-CM | POA: Diagnosis not present

## 2021-11-28 DIAGNOSIS — M24574 Contracture, right foot: Secondary | ICD-10-CM | POA: Diagnosis not present

## 2021-11-28 DIAGNOSIS — M79675 Pain in left toe(s): Secondary | ICD-10-CM | POA: Diagnosis not present

## 2021-11-28 DIAGNOSIS — Z7409 Other reduced mobility: Secondary | ICD-10-CM | POA: Diagnosis not present

## 2021-12-02 ENCOUNTER — Encounter: Payer: Self-pay | Admitting: Cardiology

## 2021-12-02 ENCOUNTER — Ambulatory Visit (INDEPENDENT_AMBULATORY_CARE_PROVIDER_SITE_OTHER): Payer: Medicare HMO | Admitting: Cardiology

## 2021-12-02 ENCOUNTER — Other Ambulatory Visit: Payer: Self-pay

## 2021-12-02 VITALS — BP 136/70 | HR 78 | Ht 70.6 in | Wt 161.2 lb

## 2021-12-02 DIAGNOSIS — E7800 Pure hypercholesterolemia, unspecified: Secondary | ICD-10-CM | POA: Diagnosis not present

## 2021-12-02 DIAGNOSIS — Z7901 Long term (current) use of anticoagulants: Secondary | ICD-10-CM

## 2021-12-02 DIAGNOSIS — Z95828 Presence of other vascular implants and grafts: Secondary | ICD-10-CM | POA: Diagnosis not present

## 2021-12-02 DIAGNOSIS — F17210 Nicotine dependence, cigarettes, uncomplicated: Secondary | ICD-10-CM

## 2021-12-02 DIAGNOSIS — Z720 Tobacco use: Secondary | ICD-10-CM

## 2021-12-02 DIAGNOSIS — I10 Essential (primary) hypertension: Secondary | ICD-10-CM

## 2021-12-02 DIAGNOSIS — Z79899 Other long term (current) drug therapy: Secondary | ICD-10-CM | POA: Diagnosis not present

## 2021-12-02 DIAGNOSIS — I2510 Atherosclerotic heart disease of native coronary artery without angina pectoris: Secondary | ICD-10-CM | POA: Diagnosis not present

## 2021-12-02 DIAGNOSIS — K7030 Alcoholic cirrhosis of liver without ascites: Secondary | ICD-10-CM | POA: Diagnosis not present

## 2021-12-02 DIAGNOSIS — I714 Abdominal aortic aneurysm, without rupture, unspecified: Secondary | ICD-10-CM

## 2021-12-02 MED ORDER — AMIODARONE HCL 100 MG PO TABS: 100.0000 mg | ORAL_TABLET | Freq: Every day | ORAL | 3 refills | Status: DC

## 2021-12-02 NOTE — Progress Notes (Signed)
Cardiology Office Note:    Date:  12/02/2021   ID:  Gregory Dyer, DOB 1946/03/25, MRN [id number]  PCP:  Serita Grammes, MD  Cardiologist:  Jenean Lindau, MD   Referring MD: Serita Grammes, MD    ASSESSMENT:    1. Abdominal aortic aneurysm (AAA) without rupture, unspecified part   2. Coronary artery disease involving native coronary artery of native heart without angina pectoris   3. Hypercholesterolemia   4. Primary hypertension   5. Chronic anticoagulation   6. 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 Tobacco abuse    PLAN:    In order of problems listed above:  Coronary artery disease: By history.  I will try to obtain records from his cardiologist and provider.  Secondary prevention stressed.  Importance of compliance with diet medication stressed and he vocalized understanding. Cigarette smoker: I spent 5 minutes with the patient discussing solely about smoking. Smoking cessation was counseled. I suggested to the patient also different medications and pharmacological interventions. Patient is keen to try stopping on its own at this time. He will get back to me if he needs any further assistance in this matter. Paroxysmal atrial fibrillation: On anticoagulation.I discussed with the patient atrial fibrillation, disease process. Management and therapy including rate and rhythm control, anticoagulation benefits and potential risks were discussed extensively with the patient. Patient had multiple questions which were answered to patient's satisfaction.  On amiodarone therapy 200 mg a day.  I am not sure I want to continue at that dosage and with history of cigarette smoking which is very chronic.  I reduced his dose to 100 mg a day.  We will get a baseline chest x-ray.  Amiodarone toxicity was explained extensively and he vocalized understanding and questions were answered to his satisfaction.  I would like him to see a pulmonologist for evaluation for this  and his smoking history and continuation of amiodarone therapy.  Hopefully this involves getting PFTs. History of abdominal aortic aneurysm repair.  Again we will try to get records.  I will get ultrasound for follow-up on this. Mixed dyslipidemia: On statin therapy.  Lipids followed by primary care. Echocardiogram done at the hospital revealed ejection fraction of 40 to 45% and wall motion abnormalities and moderate mitral regurgitation.  This was echo done on March 2022. Patient will be seen in follow-up appointment in 6 months or earlier if the patient has any concerns    Medication Adjustments/Labs and Tests Ordered: Current medicines are reviewed at length with the patient today.  Concerns regarding medicines are outlined above.  No orders of the defined types were placed in this encounter.  No orders of the defined types were placed in this encounter.    History of Present Illness:    Gregory Dyer is a 75 y.o. male who is being seen today for the evaluation of to be established for cardiovascular disease and atrial fibrillation at the request of Serita Grammes, MD. patient is a pleasant 75 year old male.  He has past medical history of coronary artery disease, essential hypertension, dyslipidemia and history of chronic long-term smoking.  He tells me that he is on history of stroke.  He is on amiodarone 200 mg daily.  He denies any problems at this time and is here to be established.  At the time of my evaluation, the patient is alert awake oriented and in no distress.  Past Medical History:  Diagnosis Date   AAA (abdominal aortic aneurysm)  Acute encephalopathy    Acute respiratory failure with hypoxia (HCC) 2/95/6213   Alcoholic cirrhosis ([place]) 08/65/7846   B12 deficiency    CAD (coronary artery disease)    stent in [place]   CAP (community acquired pneumonia) 05/05/2016   Chronic anticoagulation    Chronic systolic heart failure ([place]) 10/26/2020   CKD (chronic kidney  disease)    Closed head injury    Fall 140' [place] in [place]   Coronary artery disease involving native coronary artery of native heart without angina pectoris 10/26/2020   stent in [place]   Elevated troponin    Gait abnormality 09/18/2017   GI bleed 12/14/2020   Heart failure ([place])    History of traumatic brain injury 09/18/2017   Hypercholesterolemia    Hyperglycemia    Hypertension    Hypertensive emergency    Insomnia    Low back pain 09/18/2017   Melena 12/15/2020   MI (myocardial infarction) [facility])    Neck pain    NSTEMI (non-ST elevated myocardial infarction) (HCC)    Osteoarthritis    Pain and swelling of right lower extremity    Paroxysmal atrial fibrillation (HCC)    PVD (peripheral vascular disease) ([place]) 10/26/2020   Formatting of this note might be different from the original. R SFA stent Formatting of this note might be different from the original. Formatting of this note might be different from the original. R SFA stent   S/P CABG x 2 10/26/2020   Formatting of this note might be different from the original. [place], [place], Dr. Emeterio Reeve. Formatting of this note might be different from the original. Formatting of this note might be different from the original. [place], [place], Dr. Emeterio Reeve.   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   apixaban (ELIQUIS) 5 MG TABS tablet Take 5 mg by mouth 2 (two) times daily.   aspirin 81 MG chewable tablet  Chew 1 tablet (81 mg total) by mouth daily.   atorvastatin (LIPITOR) 80 MG tablet Take 1 tablet (80 mg total) by mouth daily at 6 PM.   carvedilol (COREG) 6.25 MG tablet Take 6.25 mg by mouth 2 (two) times daily.   mirtazapine (REMERON) 7.5 MG tablet Take 7.5 mg by mouth at bedtime.   potassium chloride SA (KLOR-CON M) 20 MEQ tablet Take 20 mEq by mouth daily.     Allergies:   No known allergies   Social History   Socioeconomic History   Marital status: Married    Spouse name: Not on file   Number of children: 0   Years of education: College   Highest education level: Not on file  Occupational History   Occupation: Retired  Tobacco Use   Smoking status: Every Day    Packs/day: 1.00    Years: 50.00    Pack years: 50.00    Types: Cigarettes   Smokeless tobacco: Never  Vaping Use  Vaping Use: Never used  Substance and Sexual Activity   Alcohol use: Yes    Comment: occasional drink   Drug use: No   Sexual activity: Yes  Other Topics Concern   Not on file  Social History Narrative   Lives at home with his wife.   Right-handed.   3 cups caffeine per day.   Social Determinants of Health   Financial Resource Strain: Not on file  Food Insecurity: Not on file  Transportation Needs: Not on file  Physical Activity: Not on file  Stress: Not on file  Social Connections: Not on file     Family History: The patient's Family history is unknown by patient.  ROS:   Please see the history of present illness.    All other systems reviewed and are negative.  EKGs/Labs/Other Studies Reviewed:    The following studies were reviewed today: I reviewed records from primary care.  He refused to get an EKG today.   Recent Labs: No results found for requested labs within last 8760 hours.  Recent Lipid Panel    Component Value Date/Time   CHOL 111 06/19/2017 0135   TRIG 108 06/19/2017 0135   HDL 24 (L) 06/19/2017 0135   CHOLHDL 4.6 06/19/2017 0135   VLDL 22 06/19/2017 0135    LDLCALC 65 06/19/2017 0135    Physical Exam:    VS:  BP 136/70   Pulse 78   Ht 5' 10.6" (1.793 m)   Wt 161 lb 3.2 oz (73.1 kg)   SpO2 97%   BMI 22.74 kg/m     Wt Readings from Last 3 Encounters:  12/02/21 161 lb 3.2 oz (73.1 kg)  09/18/17 188 lb 4 oz (85.4 kg)  09/06/17 190 lb (86.2 kg)     GEN: Patient is in no acute distress HEENT: Normal NECK: No JVD; No carotid bruits LYMPHATICS: No lymphadenopathy CARDIAC: S1 S2 regular, 2/6 systolic murmur at the apex. RESPIRATORY:  Clear to auscultation without rales, wheezing or rhonchi  ABDOMEN: Soft, non-tender, non-distended MUSCULOSKELETAL:  No edema; No deformity  SKIN: Warm and dry NEUROLOGIC:  Alert and oriented x 3 PSYCHIATRIC:  Normal affect    Signed, Jenean Lindau, MD  12/02/2021 12:41 PM    [facility]

## 2021-12-02 NOTE — Patient Instructions (Addendum)
Medication Instructions:  Your physician has recommended you make the following change in your medication:   Decrease your Amiodarone to 100 mg daily.    *If you need a refill on your cardiac medications before your next appointment, please call your pharmacy*   Lab Work: None ordered If you have labs (blood work) drawn today and your tests are completely normal, you will receive your results only by: [place] (if you have MyChart) OR A paper copy in the mail If you have any lab test that is abnormal or we need to change your treatment, we will call you to review the results.   Testing/Procedures: A chest x-ray takes a picture of the organs and structures inside the chest, including the heart, lungs, and blood vessels. This test can show several things, including, whether the heart is enlarges; whether fluid is building up in the lungs; and whether pacemaker / defibrillator leads are still in place.    Follow-Up: At [facility], you and your health needs are our priority.  As part of our continuing mission to provide you with exceptional heart care, we have created designated Provider Care Teams.  These Care Teams include your primary Cardiologist (physician) and Advanced Practice Providers (APPs -  Physician Assistants and Nurse Practitioners) who all work together to provide you with the care you need, when you need it.  We recommend signing up for the patient portal called "MyChart".  Sign up information is provided on this After Visit Summary.  MyChart is used to connect with patients for Virtual Visits (Telemedicine).  Patients are able to view lab/test results, encounter notes, upcoming appointments, etc.  Non-urgent messages can be sent to your provider as well.   To learn more about what you can do with MyChart, go to [email or web address].    Your next appointment:   6 month(s)  The format for your next appointment:   In Person  Provider:   Jyl Heinz,  MD   Other Instructions NA

## 2021-12-13 DIAGNOSIS — Z6822 Body mass index (BMI) 22.0-22.9, adult: Secondary | ICD-10-CM | POA: Diagnosis not present

## 2021-12-13 DIAGNOSIS — M24574 Contracture, right foot: Secondary | ICD-10-CM | POA: Diagnosis not present

## 2021-12-13 DIAGNOSIS — M79604 Pain in right leg: Secondary | ICD-10-CM | POA: Diagnosis not present

## 2021-12-13 DIAGNOSIS — I5022 Chronic systolic (congestive) heart failure: Secondary | ICD-10-CM | POA: Diagnosis not present

## 2021-12-13 DIAGNOSIS — R5381 Other malaise: Secondary | ICD-10-CM | POA: Diagnosis not present

## 2021-12-13 DIAGNOSIS — M7989 Other specified soft tissue disorders: Secondary | ICD-10-CM | POA: Diagnosis not present

## 2021-12-20 ENCOUNTER — Other Ambulatory Visit: Payer: Medicare HMO

## 2021-12-21 ENCOUNTER — Telehealth: Payer: Self-pay | Admitting: Cardiology

## 2021-12-21 NOTE — Telephone Encounter (Signed)
New Message:      Patient said he tried to call off and on all day yesterday(12-20-21) to cx his appointment, he was unable to get through due to the high call volume. Patient was sick and unable to come, he does want to receive a No Show Charge

## 2022-01-02 ENCOUNTER — Telehealth: Payer: Self-pay | Admitting: Cardiology

## 2022-01-02 NOTE — Telephone Encounter (Signed)
Spoke with patient and reviewed there is no longer a office in [place]. He then wanted to know why he is seeing pulmonary "in the first place". Reviewed that this referral is due to one of the medications he takes and to monitor his lungs. Discussed the importance of seeing pulmonary provider and reasons provider requested he see them. Pulmonary scheduler here in [place] assisted with scheduling appointment on 02/06/22 at 10:30 am at the [facility] office. He then also wanted to reschedule his AAA test due to his car not cranking. Rescheduled that to 01/10/21 at 11:30 am there in the [place] office. He confirmed both appointments, dates, times, and locations. He verbalized understanding of our conversation, agreement with plan, and had no further questions at this time.

## 2022-01-02 NOTE — Telephone Encounter (Signed)
Gregory Dyer at [facility] states that hp office is closed down..offered to schedule pt at [facility] office pt refuses as this is a 87min drive [facility] is closing referral and wanted to advise.

## 2022-01-03 ENCOUNTER — Other Ambulatory Visit: Payer: Medicare HMO

## 2022-01-10 ENCOUNTER — Other Ambulatory Visit: Payer: Medicare HMO

## 2022-01-18 ENCOUNTER — Other Ambulatory Visit: Payer: Medicare HMO

## 2022-01-25 ENCOUNTER — Other Ambulatory Visit: Payer: Medicare HMO

## 2022-01-31 ENCOUNTER — Other Ambulatory Visit: Payer: Medicare HMO

## 2022-02-06 ENCOUNTER — Institutional Professional Consult (permissible substitution): Payer: Medicare HMO | Admitting: Pulmonary Disease

## 2022-02-06 NOTE — Progress Notes (Unsigned)
Subjective:   PATIENT ID: Gregory Dyer GENDER: male DOB: 26-May-1946, MRN: [id number]   HPI  No chief complaint on file.  Reason for Visit: New consult for smoking and amiodarone  Gregory Dyer is a 76 year old male with CAD s/p CABG, chronic systolic heart failure, AAA, CKD stage I, hypertension, atrial fibrillation, alcoholic cirrhosis who presents as referral from Cardiology for PFTs in setting of amiodarone use.  He was recently seen by cardiology on 12/02/2021.  Note was reviewed by Dr. Geraldo Pitter.  Active smoker and counseled on smoking cessation. He has been on amiodarone for his atrial fibrillation and surveillance for toxicity was reviewed. He was referred to Pulmonary to establish care and arrange for PFTs. *** Social History:  Environmental exposures: ***  I have personally reviewed patient's past medical/family/social history, allergies, current medications.  Past Medical History:  Diagnosis Date   AAA (abdominal aortic aneurysm)    Acute encephalopathy    Acute respiratory failure with hypoxia ([place]) 5/72/6203   Alcoholic cirrhosis ([place]) 55/97/4163   B12 deficiency    CAD (coronary artery disease)    stent in [place]   CAP (community acquired pneumonia) 05/05/2016   Chronic anticoagulation    Chronic systolic heart failure ([place]) 10/26/2020   CKD (chronic kidney disease)    Closed head injury    Fall 140' [place]   Coronary artery disease involving native coronary artery of native heart without angina pectoris 10/26/2020   stent in [place]   Elevated troponin    Gait abnormality 09/18/2017   GI bleed 12/14/2020   Heart failure ([place])    History of traumatic brain injury 09/18/2017   Hypercholesterolemia    Hyperglycemia    Hypertension    Hypertensive emergency    Insomnia    Low back pain 09/18/2017   Melena 12/15/2020   MI (myocardial infarction) [facility])    Neck pain    NSTEMI (non-ST elevated myocardial infarction) (HCC)    Osteoarthritis     Pain and swelling of right lower extremity    Paroxysmal atrial fibrillation (HCC)    PVD (peripheral vascular disease) ([place]) 10/26/2020   Formatting of this note might be different from the original. R SFA stent Formatting of this note might be different from the original. Formatting of this note might be different from the original. R SFA stent   S/P CABG x 2 10/26/2020   Formatting of this note might be different from the original. [place], [place], Dr. Emeterio Reeve. Formatting of this note might be different from the original. Formatting of this note might be different from the original. [place], [place], Dr. Emeterio Reeve.   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 10/26/2020   Sepsis due to pneumonia [facility])    Tobacco abuse    Unstable angina ([place]) 06/18/2017     Family History  Family history unknown: Yes     Social History   Occupational History   Occupation: Retired  Tobacco Use   Smoking status: Every Day    Packs/day: 1.00    Years: 50.00    Pack years: 50.00    Types: Cigarettes   Smokeless tobacco: Never  Vaping Use   Vaping Use: Never used  Substance and Sexual Activity   Alcohol use: Yes    Comment: occasional drink   Drug use: No   Sexual activity: Yes    Allergies  Allergen Reactions   No Known Allergies      Outpatient Medications Prior to Visit  Medication Sig Dispense Refill   amiodarone (PACERONE) 100 MG tablet Take 1 tablet (100 mg total) by mouth daily. 90 tablet 3   apixaban (ELIQUIS) 5 MG TABS tablet Take 5 mg by mouth 2 (two) times daily.     aspirin 81 MG chewable tablet Chew 1 tablet (81 mg total) by mouth daily. 30 tablet 2   atorvastatin (LIPITOR) 80 MG tablet Take 1 tablet (80 mg total) by mouth daily at 6 PM. 30 tablet 2   carvedilol (COREG) 6.25 MG tablet Take 6.25 mg by mouth 2 (two) times daily.     mirtazapine (REMERON) 7.5 MG tablet Take 7.5 mg by mouth at bedtime.     potassium chloride SA (KLOR-CON M) 20 MEQ tablet Take  20 mEq by mouth daily.     No facility-administered medications prior to visit.    ROS   Objective:  There were no vitals filed for this visit.    Physical Exam: General: Well-appearing, no acute distress HENT: [place], AT, OP clear, MMM Eyes: EOMI, no scleral icterus Respiratory: Clear to auscultation bilaterally.  No crackles, wheezing or rales Cardiovascular: RRR, -M/R/G, no JVD GI: BS+, soft, nontender Extremities:-Edema,-tenderness Neuro: AAO x4, CNII-XII grossly intact Skin: Intact, no rashes or bruising Psych: Normal mood, normal affect  Data Reviewed:  Imaging: CXR 09/06/22 - Stable coarse interstitial markings  PFT: None on file  Labs: CBC    Component Value Date/Time   WBC 6.4 06/19/2017 0452   RBC 4.09 (L) 06/19/2017 0452   HGB 13.3 06/19/2017 0452   HCT 39.9 06/19/2017 0452   PLT 145 (L) 06/19/2017 0452   MCV 97.6 06/19/2017 0452   MCH 32.5 06/19/2017 0452   MCHC 33.3 06/19/2017 0452   RDW 13.6 06/19/2017 0452   LYMPHSABS 11.1 (H) 05/05/2016 1545   MONOABS 1.9 (H) 05/05/2016 1545   EOSABS 0.4 05/05/2016 1545   BASOSABS 0.0 05/05/2016 1545   BMET    Component Value Date/Time   NA 139 06/19/2017 0452   K 4.6 06/19/2017 0452   CL 108 06/19/2017 0452   CO2 22 06/19/2017 0452   GLUCOSE 101 (H) 06/19/2017 0452   BUN 30 (H) 06/19/2017 0452   CREATININE 1.70 (H) 06/19/2017 0452   CALCIUM 9.2 06/19/2017 0452   GFRNONAA 39 (L) 06/19/2017 0452   AKI in 2018    Assessment & Plan:   Discussion: 76 year old male with CAD s/p CABG, chronic systolic heart failure, AAA, CKD stage I, hypertension, atrial fibrillation, alcoholic cirrhosis   Health Maintenance  There is no immunization history on file for this patient. CT Lung Screen***  No orders of the defined types were placed in this encounter. No orders of the defined types were placed in this encounter.   No follow-ups on file.  I have spent a total time of***-minutes on the day of the  appointment reviewing prior documentation, coordinating care and discussing medical diagnosis and plan with the patient/family. Imaging, labs and tests included in this note have been reviewed and interpreted independently by me.  [place], MD [facility] 02/06/2022 10:31 AM  Office Number [phone]

## 2022-02-13 ENCOUNTER — Other Ambulatory Visit: Payer: Medicare HMO

## 2022-02-15 ENCOUNTER — Other Ambulatory Visit: Payer: Medicare HMO

## 2022-02-22 ENCOUNTER — Other Ambulatory Visit: Payer: Medicare HMO

## 2022-02-23 ENCOUNTER — Other Ambulatory Visit: Payer: Medicare HMO

## 2022-03-07 DIAGNOSIS — D539 Nutritional anemia, unspecified: Secondary | ICD-10-CM | POA: Diagnosis not present

## 2022-03-07 DIAGNOSIS — E559 Vitamin D deficiency, unspecified: Secondary | ICD-10-CM | POA: Diagnosis not present

## 2022-03-07 DIAGNOSIS — Z7901 Long term (current) use of anticoagulants: Secondary | ICD-10-CM | POA: Diagnosis not present

## 2022-03-07 DIAGNOSIS — I10 Essential (primary) hypertension: Secondary | ICD-10-CM | POA: Diagnosis not present

## 2022-03-07 DIAGNOSIS — I480 Paroxysmal atrial fibrillation: Secondary | ICD-10-CM | POA: Diagnosis not present

## 2022-03-07 DIAGNOSIS — F329 Major depressive disorder, single episode, unspecified: Secondary | ICD-10-CM | POA: Diagnosis not present

## 2022-03-07 DIAGNOSIS — Z79899 Other long term (current) drug therapy: Secondary | ICD-10-CM | POA: Diagnosis not present

## 2022-03-07 DIAGNOSIS — E782 Mixed hyperlipidemia: Secondary | ICD-10-CM | POA: Diagnosis not present

## 2022-03-07 DIAGNOSIS — G4700 Insomnia, unspecified: Secondary | ICD-10-CM | POA: Diagnosis not present

## 2022-03-16 ENCOUNTER — Telehealth: Payer: Self-pay | Admitting: Cardiology

## 2022-03-16 NOTE — Telephone Encounter (Signed)
Paroxysmal atrial fibrillation: On anticoagulation.I discussed with the patient atrial fibrillation, disease process. ? ?Last cath report was 04/2016. ? ?Please advise. ?

## 2022-03-16 NOTE — Telephone Encounter (Signed)
New Message: ? ? ? ? ?She wants to  know if Dr Geraldo Pitter wants the patient to stay on both Aspirin and Eliquis? ?

## 2022-03-20 NOTE — Telephone Encounter (Signed)
Spoke with pt and [facility] and notified them that the pt is to continue with both medications. ?

## 2022-03-21 ENCOUNTER — Telehealth: Payer: Self-pay | Admitting: Cardiology

## 2022-03-21 MED ORDER — AMIODARONE HCL 100 MG PO TABS: 100.0000 mg | ORAL_TABLET | Freq: Every day | ORAL | 3 refills | Status: DC

## 2022-03-21 NOTE — Telephone Encounter (Signed)
?*  STAT* If patient is at the pharmacy, call can be transferred to refill team. ? ? ?1. Which medications need to be refilled? (please list name of each medication and dose if known) amiodarone (PACERONE) 100 MG tablet ? ?2. Which pharmacy/location (including street and city if local pharmacy) is medication to be sent to? [facility] - [place], [place] - [place] [address] ? ?3. Do they need a 30 day or 90 day supply? 30 with refills ? ?Patient has recently switched to [place] so it can be delivered to him  ?

## 2022-03-21 NOTE — Telephone Encounter (Signed)
Medication sent as requested.

## 2022-03-24 DIAGNOSIS — E785 Hyperlipidemia, unspecified: Secondary | ICD-10-CM | POA: Diagnosis not present

## 2022-03-24 DIAGNOSIS — I10 Essential (primary) hypertension: Secondary | ICD-10-CM | POA: Diagnosis not present

## 2022-05-24 DIAGNOSIS — I10 Essential (primary) hypertension: Secondary | ICD-10-CM | POA: Diagnosis not present

## 2022-05-24 DIAGNOSIS — E785 Hyperlipidemia, unspecified: Secondary | ICD-10-CM | POA: Diagnosis not present

## 2022-05-30 DIAGNOSIS — Z6822 Body mass index (BMI) 22.0-22.9, adult: Secondary | ICD-10-CM | POA: Diagnosis not present

## 2022-05-30 DIAGNOSIS — R42 Dizziness and giddiness: Secondary | ICD-10-CM | POA: Diagnosis not present

## 2022-06-01 DIAGNOSIS — I2510 Atherosclerotic heart disease of native coronary artery without angina pectoris: Secondary | ICD-10-CM | POA: Insufficient documentation

## 2022-06-02 ENCOUNTER — Encounter: Payer: Self-pay | Admitting: Cardiology

## 2022-06-02 ENCOUNTER — Ambulatory Visit: Payer: Medicare HMO | Admitting: Cardiology

## 2022-06-02 ENCOUNTER — Ambulatory Visit (INDEPENDENT_AMBULATORY_CARE_PROVIDER_SITE_OTHER): Payer: Medicare HMO

## 2022-06-02 VITALS — BP 128/60 | HR 65 | Ht 71.0 in | Wt 148.6 lb

## 2022-06-02 DIAGNOSIS — I480 Paroxysmal atrial fibrillation: Secondary | ICD-10-CM | POA: Diagnosis not present

## 2022-06-02 DIAGNOSIS — Z95828 Presence of other vascular implants and grafts: Secondary | ICD-10-CM | POA: Diagnosis not present

## 2022-06-02 DIAGNOSIS — Z951 Presence of aortocoronary bypass graft: Secondary | ICD-10-CM | POA: Diagnosis not present

## 2022-06-02 DIAGNOSIS — I700 Atherosclerosis of aorta: Secondary | ICD-10-CM | POA: Diagnosis not present

## 2022-06-02 DIAGNOSIS — K7030 Alcoholic cirrhosis of liver without ascites: Secondary | ICD-10-CM | POA: Diagnosis not present

## 2022-06-02 DIAGNOSIS — Z720 Tobacco use: Secondary | ICD-10-CM

## 2022-06-02 DIAGNOSIS — I2510 Atherosclerotic heart disease of native coronary artery without angina pectoris: Secondary | ICD-10-CM

## 2022-06-02 DIAGNOSIS — F17210 Nicotine dependence, cigarettes, uncomplicated: Secondary | ICD-10-CM | POA: Diagnosis not present

## 2022-06-02 DIAGNOSIS — E7800 Pure hypercholesterolemia, unspecified: Secondary | ICD-10-CM

## 2022-06-02 DIAGNOSIS — J439 Emphysema, unspecified: Secondary | ICD-10-CM | POA: Diagnosis not present

## 2022-06-02 DIAGNOSIS — Z79899 Other long term (current) drug therapy: Secondary | ICD-10-CM | POA: Diagnosis not present

## 2022-06-02 MED ORDER — CARVEDILOL 3.125 MG PO TABS: 3.1250 mg | ORAL_TABLET | Freq: Two times a day (BID) | ORAL | 3 refills | Status: DC

## 2022-06-02 MED ORDER — APIXABAN 5 MG PO TABS: 5.0000 mg | ORAL_TABLET | Freq: Two times a day (BID) | ORAL | 6 refills | Status: DC

## 2022-06-02 NOTE — Patient Instructions (Addendum)
Medication Instructions:  Your physician has recommended you make the following change in your medication: Eliquis '5mg'$  1 tablet by mouth twice daily                     Coreg 3.125  by mouth daily    Lab Work: None Ordered If you have labs (blood work) drawn today and your tests are completely normal, you will receive your results only by: [place] (if you have MyChart) OR A paper copy in the mail If you have any lab test that is abnormal or we need to change your treatment, we will call you to review the results.   Testing/Procedures: Chest X-Ray Today At [place]? The Zio system is proven and trusted by physicians to detect and diagnose irregular heart rhythms -- and has been prescribed to hundreds of thousands of patients.  The FDA has cleared the Zio system to monitor for many different kinds of irregular heart rhythms. In a study, physicians were able to reach a diagnosis 90% of the time with the Zio system1.  You can wear the Zio monitor -- a small, discreet, comfortable patch -- during your normal day-to-day activity, including while you sleep, shower, and exercise, while it records every single heartbeat for analysis.  1Barrett, P., et al. Comparison of 24 Hour Holter Monitoring Versus 14 Day Novel Adhesive Patch Electrocardiographic Monitoring. [place], 2014.  ZIO VS. HOLTER MONITORING The Zio monitor can be comfortably worn for up to 14 days. Holter monitors can be worn for 24 to 48 hours, limiting the time to record any irregular heart rhythms you may have. Zio is able to capture data for the 51% of patients who have their first symptom-triggered arrhythmia after 48 hours.1  LIVE WITHOUT RESTRICTIONS The Zio ambulatory cardiac monitor is a small, unobtrusive, and water-resistant patch--you might even forget you're wearing it. The Zio monitor records and stores every beat of your heart, whether you're  sleeping, working out, or showering.      Follow-Up: At [facility], you and your health needs are our priority.  As part of our continuing mission to provide you with exceptional heart care, we have created designated Provider Care Teams.  These Care Teams include your primary Cardiologist (physician) and Advanced Practice Providers (APPs -  Physician Assistants and Nurse Practitioners) who all work together to provide you with the care you need, when you need it.  We recommend signing up for the patient portal called "MyChart".  Sign up information is provided on this After Visit Summary.  MyChart is used to connect with patients for Virtual Visits (Telemedicine).  Patients are able to view lab/test results, encounter notes, upcoming appointments, etc.  Non-urgent messages can be sent to your provider as well.   To learn more about what you can do with MyChart, go to [email or web address].    Your next appointment:   6 month(s)  The format for your next appointment:   In Person  Provider:   Jyl Heinz, MD    Other Instructions NA

## 2022-06-02 NOTE — Progress Notes (Signed)
Cardiology Office Note:    Date:  06/02/2022   ID:  Gregory Dyer, DOB 1946-04-17, MRN [id number]  PCP:  Serita Grammes, MD  Cardiologist:  Jenean Lindau, MD   Referring MD: Serita Grammes, MD    ASSESSMENT:    1. Coronary artery disease involving native coronary artery of native heart without angina pectoris   2. Paroxysmal atrial fibrillation (HCC)   3. S/P CABG x 2   4. Tobacco abuse   5. Hypercholesterolemia   6. 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  Importance of compliance with diet medication stressed any vocalized understanding.  He is followed by primary care with blood work Paroxysmal atrial fibrillation:I discussed with the patient atrial fibrillation, disease process. Management and therapy including rate and rhythm control, anticoagulation benefits and potential risks were discussed extensively with the patient. Patient had multiple questions which were answered to patient's satisfaction.  We will try to see if he qualifies for medications through the pharmaceutical company. Dizziness: I reduced carvedilol to 3.125 mg daily.  We will do a 2-week monitor to see if he has any issues with bradycardia arrhythmias that are reported in the symptoms. Patient dyslipidemia: On statin therapy followed by primary care Amiodarone therapy: He did not get a chest x-ray done last time in spite of Eliquis.  We will check this today.  Also his blood work such as LFTs are followed by primary care.  Benefits and potential risks of the medication explained and questions were answered to his satisfaction. Cigarette smoker: I spent 5 minutes with the patient discussing solely about smoking. Smoking cessation was counseled. I suggested to the patient also different medications and pharmacological interventions. Patient is keen to try stopping on  its own at this time. He will get back to me if he needs any further assistance in this matter. Patient will be seen in follow-up appointment in 6 months or earlier if the patient has any concerns    Medication Adjustments/Labs and Tests Ordered: Current medicines are reviewed at length with the patient today.  Concerns regarding medicines are outlined above.  Orders Placed This Encounter  Procedures   EKG 12-Lead   Meds ordered this encounter  Medications   carvedilol (COREG) 3.125 MG tablet    Sig: Take 1 tablet (3.125 mg total) by mouth 2 (two) times daily.    Dispense:  90 tablet    Refill:  3   apixaban (ELIQUIS) 5 MG TABS tablet    Sig: Take 1 tablet (5 mg total) by mouth 2 (two) times daily.    Dispense:  60 tablet    Refill:  6     No chief complaint on file.    History of Present Illness:    Gregory Dyer is a 76 y.o. male.  Patient has past medical history of coronary artery disease, paroxysmal atrial fibrillation, mixed dyslipidemia.  He gives history of some dizziness at times.  He tells me it happens many times at standing and position.  His postural blood pressure was checked yesterday at his primary care and he told me that it was fine.  No syncope.  Past Medical History:  Diagnosis Date   AAA (abdominal aortic aneurysm) ([place])    Acute encephalopathy    Acute respiratory failure with hypoxia ([place]) 2/75/1700   Alcoholic cirrhosis ([place]) 17/49/4496   B12 deficiency  CAD (coronary artery disease)    stent in [place]   CAP (community acquired pneumonia) 05/05/2016   Chronic anticoagulation    Chronic systolic heart failure ([place]) 10/26/2020   CKD (chronic kidney disease)    Closed head injury    Fall 140' treated in [place]   Coronary artery disease involving native coronary artery of native heart without angina pectoris 10/26/2020   stent in [place]   Elevated troponin    Gait abnormality 09/18/2017   GI bleed 12/14/2020   Heart failure ([place])    History of  traumatic brain injury 09/18/2017   Hypercholesterolemia    Hyperglycemia    Hypertension    Hypertensive emergency    Insomnia    Low back pain 09/18/2017   Melena 12/15/2020   MI (myocardial infarction) [facility])    Neck pain    NSTEMI (non-ST elevated myocardial infarction) (HCC)    Osteoarthritis    Pain and swelling of right lower extremity    Paroxysmal atrial fibrillation (HCC)    PVD (peripheral vascular disease) ([place]) 10/26/2020   Formatting of this note might be different from the original. R SFA stent Formatting of this note might be different from the original. Formatting of this note might be different from the original. R SFA stent   S/P CABG x 2 10/26/2020   Formatting of this note might be different from the original. [place], [place], Dr. Emeterio Reeve. Formatting of this note might be different from the original. Formatting of this note might be different from the original. [place], [place], Dr. Emeterio Reeve.   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   apixaban (ELIQUIS) 5 MG TABS tablet Take 1 tablet (5 mg total) by mouth 2 (two) times daily.   aspirin 81 MG chewable tablet Chew 1 tablet (81 mg total) by mouth daily.   atorvastatin (LIPITOR) 80 MG tablet Take 1 tablet (80 mg total) by mouth daily at 6 PM.   carvedilol (COREG) 3.125 MG tablet Take 1 tablet (3.125 mg total) by mouth 2 (two) times daily.   cholecalciferol (VITAMIN D3) 25 MCG (1000 UNIT) tablet  Take 1,000 Units by mouth daily.   furosemide (LASIX) 20 MG tablet Take 20 mg by mouth daily.   mirtazapine (REMERON) 7.5 MG tablet Take 7.5 mg by mouth at bedtime.   Potassium Chloride ER 20 MEQ TBCR Take 1 tablet by mouth daily.   potassium chloride SA (KLOR-CON M) 20 MEQ tablet Take 20 mEq by mouth daily.   traMADol (ULTRAM) 50 MG tablet Take 50 mg by mouth 2 (two) times daily.   [DISCONTINUED] apixaban (ELIQUIS) 5 MG TABS tablet Take 2.5 mg by mouth 2 (two) times daily.   [DISCONTINUED] carvedilol (COREG) 6.25 MG tablet Take 6.25 mg by mouth 2 (two) times daily.     Allergies:   No known allergies   Social History   Socioeconomic History   Marital status: Married  Spouse name: Not on file   Number of children: 0   Years of education: College   Highest education level: Not on file  Occupational History   Occupation: Retired  Tobacco Use   Smoking status: Every Day    Packs/day: 1.00    Years: 50.00    Total pack years: 50.00    Types: Cigarettes   Smokeless tobacco: Never  Vaping Use   Vaping Use: Never used  Substance and Sexual Activity   Alcohol use: Yes    Comment: occasional drink   Drug use: No   Sexual activity: Yes  Other Topics Concern   Not on file  Social History Narrative   Lives at home with his wife.   Right-handed.   3 cups caffeine per day.   Social Determinants of Health   Financial Resource Strain: Not on file  Food Insecurity: Not on file  Transportation Needs: Not on file  Physical Activity: Not on file  Stress: Not on file  Social Connections: Not on file     Family History: The patient's Family history is unknown by patient.  ROS:   Please see the history of present illness.    All other systems reviewed and are negative.  EKGs/Labs/Other Studies Reviewed:    The following studies were reviewed today: EKG reveals sinus bradycardia nonspecific ST-T changes   Recent Labs: No results found for requested labs within last 365  days.  Recent Lipid Panel    Component Value Date/Time   CHOL 111 06/19/2017 0135   TRIG 108 06/19/2017 0135   HDL 24 (L) 06/19/2017 0135   CHOLHDL 4.6 06/19/2017 0135   VLDL 22 06/19/2017 0135   LDLCALC 65 06/19/2017 0135    Physical Exam:    VS:  BP 128/60   Pulse 65   Ht '5\' 11"'$  (1.803 m)   Wt 148 lb 9.6 oz (67.4 kg)   SpO2 95%   BMI 20.73 kg/m     Wt Readings from Last 3 Encounters:  06/02/22 148 lb 9.6 oz (67.4 kg)  12/02/21 161 lb 3.2 oz (73.1 kg)  09/18/17 188 lb 4 oz (85.4 kg)     GEN: Patient is in no acute distress HEENT: Normal NECK: No JVD; No carotid bruits LYMPHATICS: No lymphadenopathy CARDIAC: Hear sounds regular, 2/6 systolic murmur at the apex. RESPIRATORY:  Clear to auscultation without rales, wheezing or rhonchi  ABDOMEN: Soft, non-tender, non-distended MUSCULOSKELETAL:  No edema; No deformity  SKIN: Warm and dry NEUROLOGIC:  Alert and oriented x 3 PSYCHIATRIC:  Normal affect   Signed, Jenean Lindau, MD  06/02/2022 2:14 PM    [facility]

## 2022-06-07 DIAGNOSIS — E782 Mixed hyperlipidemia: Secondary | ICD-10-CM | POA: Diagnosis not present

## 2022-06-07 DIAGNOSIS — R42 Dizziness and giddiness: Secondary | ICD-10-CM | POA: Diagnosis not present

## 2022-06-07 DIAGNOSIS — I5022 Chronic systolic (congestive) heart failure: Secondary | ICD-10-CM | POA: Diagnosis not present

## 2022-06-07 DIAGNOSIS — Z7901 Long term (current) use of anticoagulants: Secondary | ICD-10-CM | POA: Diagnosis not present

## 2022-06-07 DIAGNOSIS — E559 Vitamin D deficiency, unspecified: Secondary | ICD-10-CM | POA: Diagnosis not present

## 2022-06-07 DIAGNOSIS — F329 Major depressive disorder, single episode, unspecified: Secondary | ICD-10-CM | POA: Diagnosis not present

## 2022-06-07 DIAGNOSIS — G4700 Insomnia, unspecified: Secondary | ICD-10-CM | POA: Diagnosis not present

## 2022-06-07 DIAGNOSIS — R5381 Other malaise: Secondary | ICD-10-CM | POA: Diagnosis not present

## 2022-06-07 DIAGNOSIS — I480 Paroxysmal atrial fibrillation: Secondary | ICD-10-CM | POA: Diagnosis not present

## 2022-06-14 ENCOUNTER — Telehealth: Payer: Self-pay | Admitting: Cardiology

## 2022-06-14 MED ORDER — CARVEDILOL 3.125 MG PO TABS: 3.1250 mg | ORAL_TABLET | Freq: Every day | ORAL | 3 refills | Status: DC

## 2022-06-14 NOTE — Telephone Encounter (Signed)
Pt c/o medication issue:  1. Name of Medication: carvedilol (COREG) 3.125 MG tablet  2. How are you currently taking this medication (dosage and times per day)?   3. Are you having a reaction (difficulty breathing--STAT)?   4. What is your medication issue? Pt's primary care physician is calling to get clarification on how this patient is to take this medication. Per last office visit note 06/09 plan #3 Dizziness: I reduced carvedilol to 3.125 mg daily.  But the caller states that under medications it states:    carvedilol (COREG) 3.125 MG tablet      Sig: Take 1 tablet (3.125 mg total) by mouth 2 (two) times daily.      Dispense:  90 tablet   Requesting clarification on how this medication should be taken.   Caller stated they can be reached at:  Ext [phone]  Please leave a message.

## 2022-06-14 NOTE — Telephone Encounter (Signed)
Spoke with Gregory Dyer who states that the Carvedilol RX is for 3.125 mg BID and the note states 3.125 mg daily. Called pt and advised his to take daily and keep a BP/HR log daily to make sure BP is stable and not to low or high. Pt verbalized understanding and had no additional questions.

## 2022-06-16 ENCOUNTER — Telehealth: Payer: Self-pay

## 2022-06-16 NOTE — Telephone Encounter (Signed)
Pt aware of his chest Doreene Eland at [facility]. CXR norma.

## 2022-06-21 DIAGNOSIS — I480 Paroxysmal atrial fibrillation: Secondary | ICD-10-CM | POA: Diagnosis not present

## 2022-06-21 DIAGNOSIS — I2510 Atherosclerotic heart disease of native coronary artery without angina pectoris: Secondary | ICD-10-CM | POA: Diagnosis not present

## 2022-06-23 DIAGNOSIS — I10 Essential (primary) hypertension: Secondary | ICD-10-CM | POA: Diagnosis not present

## 2022-06-23 DIAGNOSIS — E785 Hyperlipidemia, unspecified: Secondary | ICD-10-CM | POA: Diagnosis not present

## 2022-06-30 ENCOUNTER — Telehealth: Payer: Self-pay

## 2022-06-30 DIAGNOSIS — I4729 Other ventricular tachycardia: Secondary | ICD-10-CM

## 2022-06-30 NOTE — Telephone Encounter (Signed)
Results reviewed with pt as per Dr. Julien Nordmann note.  Pt verbalized understanding and had no additional questions. Routed to PCP.  Instructions for lexiscan reviewed and mailed to patient. Pt aware that he needs an echo and labs.

## 2022-06-30 NOTE — Telephone Encounter (Signed)
-----   Message from Jenean Lindau, MD sent at 06/26/2022  9:04 AM EDT ----- Mildly abnormal report.  I want the patient to come in for echo, Lexiscan, Chem-7 and magnesium level.  Copy primary care Jenean Lindau, MD 06/26/2022 9:03 AM

## 2022-07-04 NOTE — Addendum Note (Signed)
Addended by: Truddie Hidden on: 07/04/2022 07:32 AM   Modules accepted: Orders

## 2022-07-04 NOTE — Addendum Note (Signed)
Addended by: Jyl Heinz R on: 07/04/2022 08:13 AM   Modules accepted: Orders

## 2022-07-06 DIAGNOSIS — I714 Abdominal aortic aneurysm, without rupture, unspecified: Secondary | ICD-10-CM | POA: Diagnosis not present

## 2022-07-06 DIAGNOSIS — K862 Cyst of pancreas: Secondary | ICD-10-CM | POA: Diagnosis not present

## 2022-07-06 DIAGNOSIS — I81 Portal vein thrombosis: Secondary | ICD-10-CM | POA: Diagnosis not present

## 2022-07-06 DIAGNOSIS — E43 Unspecified severe protein-calorie malnutrition: Secondary | ICD-10-CM | POA: Diagnosis not present

## 2022-07-06 DIAGNOSIS — J439 Emphysema, unspecified: Secondary | ICD-10-CM | POA: Diagnosis not present

## 2022-07-06 DIAGNOSIS — R531 Weakness: Secondary | ICD-10-CM | POA: Diagnosis not present

## 2022-07-06 DIAGNOSIS — C229 Malignant neoplasm of liver, not specified as primary or secondary: Secondary | ICD-10-CM | POA: Insufficient documentation

## 2022-07-06 DIAGNOSIS — R0789 Other chest pain: Secondary | ICD-10-CM | POA: Diagnosis not present

## 2022-07-06 DIAGNOSIS — R1084 Generalized abdominal pain: Secondary | ICD-10-CM | POA: Diagnosis not present

## 2022-07-06 DIAGNOSIS — R079 Chest pain, unspecified: Secondary | ICD-10-CM | POA: Diagnosis not present

## 2022-07-06 DIAGNOSIS — N281 Cyst of kidney, acquired: Secondary | ICD-10-CM | POA: Diagnosis not present

## 2022-07-06 DIAGNOSIS — K760 Fatty (change of) liver, not elsewhere classified: Secondary | ICD-10-CM | POA: Diagnosis not present

## 2022-07-06 DIAGNOSIS — K5730 Diverticulosis of large intestine without perforation or abscess without bleeding: Secondary | ICD-10-CM | POA: Diagnosis not present

## 2022-07-06 DIAGNOSIS — C220 Liver cell carcinoma: Secondary | ICD-10-CM | POA: Diagnosis not present

## 2022-07-06 DIAGNOSIS — I361 Nonrheumatic tricuspid (valve) insufficiency: Secondary | ICD-10-CM | POA: Diagnosis not present

## 2022-07-06 DIAGNOSIS — I340 Nonrheumatic mitral (valve) insufficiency: Secondary | ICD-10-CM | POA: Diagnosis not present

## 2022-07-06 DIAGNOSIS — R1030 Lower abdominal pain, unspecified: Secondary | ICD-10-CM | POA: Diagnosis not present

## 2022-07-07 DIAGNOSIS — Z9049 Acquired absence of other specified parts of digestive tract: Secondary | ICD-10-CM | POA: Diagnosis not present

## 2022-07-07 DIAGNOSIS — R188 Other ascites: Secondary | ICD-10-CM | POA: Diagnosis not present

## 2022-07-07 DIAGNOSIS — C220 Liver cell carcinoma: Secondary | ICD-10-CM | POA: Diagnosis not present

## 2022-07-07 DIAGNOSIS — E43 Unspecified severe protein-calorie malnutrition: Secondary | ICD-10-CM | POA: Diagnosis not present

## 2022-07-07 DIAGNOSIS — K769 Liver disease, unspecified: Secondary | ICD-10-CM | POA: Diagnosis not present

## 2022-07-07 DIAGNOSIS — I81 Portal vein thrombosis: Secondary | ICD-10-CM | POA: Diagnosis not present

## 2022-07-08 DIAGNOSIS — C787 Secondary malignant neoplasm of liver and intrahepatic bile duct: Secondary | ICD-10-CM | POA: Diagnosis not present

## 2022-07-08 DIAGNOSIS — I482 Chronic atrial fibrillation, unspecified: Secondary | ICD-10-CM | POA: Diagnosis not present

## 2022-07-08 DIAGNOSIS — I10 Essential (primary) hypertension: Secondary | ICD-10-CM | POA: Diagnosis not present

## 2022-07-08 DIAGNOSIS — K551 Chronic vascular disorders of intestine: Secondary | ICD-10-CM | POA: Diagnosis not present

## 2022-07-08 DIAGNOSIS — R531 Weakness: Secondary | ICD-10-CM | POA: Diagnosis not present

## 2022-07-08 DIAGNOSIS — I110 Hypertensive heart disease with heart failure: Secondary | ICD-10-CM | POA: Diagnosis not present

## 2022-07-08 DIAGNOSIS — I509 Heart failure, unspecified: Secondary | ICD-10-CM | POA: Diagnosis not present

## 2022-07-08 DIAGNOSIS — C229 Malignant neoplasm of liver, not specified as primary or secondary: Secondary | ICD-10-CM | POA: Diagnosis not present

## 2022-07-08 DIAGNOSIS — I81 Portal vein thrombosis: Secondary | ICD-10-CM | POA: Diagnosis not present

## 2022-07-08 DIAGNOSIS — Z7401 Bed confinement status: Secondary | ICD-10-CM | POA: Diagnosis not present

## 2022-07-08 DIAGNOSIS — C228 Malignant neoplasm of liver, primary, unspecified as to type: Secondary | ICD-10-CM | POA: Diagnosis not present

## 2022-07-08 DIAGNOSIS — J449 Chronic obstructive pulmonary disease, unspecified: Secondary | ICD-10-CM | POA: Diagnosis not present

## 2022-07-08 DIAGNOSIS — C220 Liver cell carcinoma: Secondary | ICD-10-CM | POA: Diagnosis not present

## 2022-07-08 DIAGNOSIS — E43 Unspecified severe protein-calorie malnutrition: Secondary | ICD-10-CM | POA: Diagnosis not present

## 2022-07-08 DIAGNOSIS — F1010 Alcohol abuse, uncomplicated: Secondary | ICD-10-CM | POA: Diagnosis not present

## 2022-07-08 DIAGNOSIS — K7689 Other specified diseases of liver: Secondary | ICD-10-CM | POA: Diagnosis not present

## 2022-07-08 DIAGNOSIS — I69351 Hemiplegia and hemiparesis following cerebral infarction affecting right dominant side: Secondary | ICD-10-CM | POA: Diagnosis not present

## 2022-07-12 DIAGNOSIS — M79632 Pain in left forearm: Secondary | ICD-10-CM | POA: Diagnosis not present

## 2022-07-12 DIAGNOSIS — C787 Secondary malignant neoplasm of liver and intrahepatic bile duct: Secondary | ICD-10-CM | POA: Diagnosis not present

## 2022-07-12 DIAGNOSIS — I509 Heart failure, unspecified: Secondary | ICD-10-CM | POA: Diagnosis not present

## 2022-07-12 DIAGNOSIS — R262 Difficulty in walking, not elsewhere classified: Secondary | ICD-10-CM | POA: Diagnosis not present

## 2022-07-12 DIAGNOSIS — R279 Unspecified lack of coordination: Secondary | ICD-10-CM | POA: Diagnosis not present

## 2022-07-12 DIAGNOSIS — F1010 Alcohol abuse, uncomplicated: Secondary | ICD-10-CM | POA: Diagnosis not present

## 2022-07-12 DIAGNOSIS — I81 Portal vein thrombosis: Secondary | ICD-10-CM | POA: Diagnosis not present

## 2022-07-12 DIAGNOSIS — I714 Abdominal aortic aneurysm, without rupture, unspecified: Secondary | ICD-10-CM | POA: Diagnosis not present

## 2022-07-12 DIAGNOSIS — F32A Depression, unspecified: Secondary | ICD-10-CM | POA: Diagnosis not present

## 2022-07-12 DIAGNOSIS — Z741 Need for assistance with personal care: Secondary | ICD-10-CM | POA: Diagnosis not present

## 2022-07-12 DIAGNOSIS — E785 Hyperlipidemia, unspecified: Secondary | ICD-10-CM | POA: Diagnosis not present

## 2022-07-12 DIAGNOSIS — Z7401 Bed confinement status: Secondary | ICD-10-CM | POA: Diagnosis not present

## 2022-07-12 DIAGNOSIS — M6281 Muscle weakness (generalized): Secondary | ICD-10-CM | POA: Diagnosis not present

## 2022-07-12 DIAGNOSIS — Z8673 Personal history of transient ischemic attack (TIA), and cerebral infarction without residual deficits: Secondary | ICD-10-CM | POA: Diagnosis not present

## 2022-07-12 DIAGNOSIS — I482 Chronic atrial fibrillation, unspecified: Secondary | ICD-10-CM | POA: Diagnosis not present

## 2022-07-12 DIAGNOSIS — C220 Liver cell carcinoma: Secondary | ICD-10-CM | POA: Diagnosis not present

## 2022-07-12 DIAGNOSIS — C229 Malignant neoplasm of liver, not specified as primary or secondary: Secondary | ICD-10-CM | POA: Diagnosis not present

## 2022-07-12 DIAGNOSIS — R269 Unspecified abnormalities of gait and mobility: Secondary | ICD-10-CM | POA: Diagnosis not present

## 2022-07-12 DIAGNOSIS — I10 Essential (primary) hypertension: Secondary | ICD-10-CM | POA: Diagnosis not present

## 2022-07-12 DIAGNOSIS — I515 Myocardial degeneration: Secondary | ICD-10-CM | POA: Diagnosis not present

## 2022-07-12 DIAGNOSIS — E43 Unspecified severe protein-calorie malnutrition: Secondary | ICD-10-CM | POA: Diagnosis not present

## 2022-07-12 DIAGNOSIS — Z951 Presence of aortocoronary bypass graft: Secondary | ICD-10-CM | POA: Diagnosis not present

## 2022-07-12 DIAGNOSIS — K551 Chronic vascular disorders of intestine: Secondary | ICD-10-CM | POA: Diagnosis not present

## 2022-07-12 DIAGNOSIS — M19032 Primary osteoarthritis, left wrist: Secondary | ICD-10-CM | POA: Diagnosis not present

## 2022-07-12 DIAGNOSIS — J449 Chronic obstructive pulmonary disease, unspecified: Secondary | ICD-10-CM | POA: Diagnosis not present

## 2022-07-12 DIAGNOSIS — R531 Weakness: Secondary | ICD-10-CM | POA: Diagnosis not present

## 2022-07-12 DIAGNOSIS — M19042 Primary osteoarthritis, left hand: Secondary | ICD-10-CM | POA: Diagnosis not present

## 2022-07-13 ENCOUNTER — Other Ambulatory Visit: Payer: Medicare HMO

## 2022-07-13 DIAGNOSIS — I10 Essential (primary) hypertension: Secondary | ICD-10-CM | POA: Diagnosis not present

## 2022-07-13 DIAGNOSIS — Z951 Presence of aortocoronary bypass graft: Secondary | ICD-10-CM | POA: Diagnosis not present

## 2022-07-13 DIAGNOSIS — I515 Myocardial degeneration: Secondary | ICD-10-CM | POA: Diagnosis not present

## 2022-07-13 DIAGNOSIS — E785 Hyperlipidemia, unspecified: Secondary | ICD-10-CM | POA: Diagnosis not present

## 2022-07-13 DIAGNOSIS — C229 Malignant neoplasm of liver, not specified as primary or secondary: Secondary | ICD-10-CM | POA: Diagnosis not present

## 2022-07-13 DIAGNOSIS — F32A Depression, unspecified: Secondary | ICD-10-CM | POA: Diagnosis not present

## 2022-07-13 DIAGNOSIS — I714 Abdominal aortic aneurysm, without rupture, unspecified: Secondary | ICD-10-CM | POA: Diagnosis not present

## 2022-07-17 ENCOUNTER — Telehealth: Payer: Self-pay

## 2022-07-17 DIAGNOSIS — Z8673 Personal history of transient ischemic attack (TIA), and cerebral infarction without residual deficits: Secondary | ICD-10-CM | POA: Diagnosis not present

## 2022-07-17 DIAGNOSIS — I482 Chronic atrial fibrillation, unspecified: Secondary | ICD-10-CM | POA: Diagnosis not present

## 2022-07-17 DIAGNOSIS — C229 Malignant neoplasm of liver, not specified as primary or secondary: Secondary | ICD-10-CM | POA: Diagnosis not present

## 2022-07-17 DIAGNOSIS — C787 Secondary malignant neoplasm of liver and intrahepatic bile duct: Secondary | ICD-10-CM | POA: Diagnosis not present

## 2022-07-17 DIAGNOSIS — R531 Weakness: Secondary | ICD-10-CM | POA: Diagnosis not present

## 2022-07-17 NOTE — Telephone Encounter (Signed)
Printed and placed in Dr. McCartys box 

## 2022-07-17 NOTE — Telephone Encounter (Signed)
-----   Message from Derwood Kaplan, MD sent at 07/13/2022  1:38 PM EDT ----- Regarding: path Pls look for path when avail.  Bx done Monday 7/17 but Dr. Ruben Im sent it up to pathologist in [place]

## 2022-07-19 DIAGNOSIS — C787 Secondary malignant neoplasm of liver and intrahepatic bile duct: Secondary | ICD-10-CM | POA: Diagnosis not present

## 2022-07-19 DIAGNOSIS — C229 Malignant neoplasm of liver, not specified as primary or secondary: Secondary | ICD-10-CM | POA: Diagnosis not present

## 2022-07-19 DIAGNOSIS — Z8673 Personal history of transient ischemic attack (TIA), and cerebral infarction without residual deficits: Secondary | ICD-10-CM | POA: Diagnosis not present

## 2022-07-19 DIAGNOSIS — R531 Weakness: Secondary | ICD-10-CM | POA: Diagnosis not present

## 2022-07-19 DIAGNOSIS — I482 Chronic atrial fibrillation, unspecified: Secondary | ICD-10-CM | POA: Diagnosis not present

## 2022-07-24 DIAGNOSIS — R531 Weakness: Secondary | ICD-10-CM | POA: Diagnosis not present

## 2022-07-24 DIAGNOSIS — C229 Malignant neoplasm of liver, not specified as primary or secondary: Secondary | ICD-10-CM | POA: Diagnosis not present

## 2022-07-24 DIAGNOSIS — C787 Secondary malignant neoplasm of liver and intrahepatic bile duct: Secondary | ICD-10-CM | POA: Diagnosis not present

## 2022-07-24 DIAGNOSIS — Z8673 Personal history of transient ischemic attack (TIA), and cerebral infarction without residual deficits: Secondary | ICD-10-CM | POA: Diagnosis not present

## 2022-07-24 DIAGNOSIS — I482 Chronic atrial fibrillation, unspecified: Secondary | ICD-10-CM | POA: Diagnosis not present

## 2022-07-30 DIAGNOSIS — M19042 Primary osteoarthritis, left hand: Secondary | ICD-10-CM | POA: Diagnosis not present

## 2022-07-30 DIAGNOSIS — M19032 Primary osteoarthritis, left wrist: Secondary | ICD-10-CM | POA: Diagnosis not present

## 2022-07-30 DIAGNOSIS — M79632 Pain in left forearm: Secondary | ICD-10-CM | POA: Diagnosis not present

## 2022-08-04 ENCOUNTER — Other Ambulatory Visit: Payer: Self-pay | Admitting: Oncology

## 2022-08-04 ENCOUNTER — Other Ambulatory Visit: Payer: Medicare HMO

## 2022-08-04 ENCOUNTER — Ambulatory Visit: Payer: Medicare HMO | Admitting: Oncology

## 2022-08-04 DIAGNOSIS — C229 Malignant neoplasm of liver, not specified as primary or secondary: Secondary | ICD-10-CM

## 2022-09-24 DIAGNOSIS — 419620001 Death: Secondary | SNOMED CT | POA: Diagnosis not present

## 2022-09-24 DEATH — deceased
